# Patient Record
Sex: Male | Born: 1955
Health system: Southern US, Community
[De-identification: ages and names within clinical notes are randomized; demographics above are authoritative.]

## PROBLEM LIST (undated history)

## (undated) DIAGNOSIS — J189 Pneumonia, unspecified organism: Secondary | ICD-10-CM

## (undated) DIAGNOSIS — N200 Calculus of kidney: Secondary | ICD-10-CM

## (undated) DIAGNOSIS — K635 Polyp of colon: Secondary | ICD-10-CM

## (undated) DIAGNOSIS — E785 Hyperlipidemia, unspecified: Secondary | ICD-10-CM

## (undated) DIAGNOSIS — R7303 Prediabetes: Secondary | ICD-10-CM

## (undated) DIAGNOSIS — I10 Essential (primary) hypertension: Secondary | ICD-10-CM

## (undated) DIAGNOSIS — E119 Type 2 diabetes mellitus without complications: Secondary | ICD-10-CM

## (undated) DIAGNOSIS — K219 Gastro-esophageal reflux disease without esophagitis: Secondary | ICD-10-CM

## (undated) HISTORY — DX: Prediabetes: R73.03

## (undated) HISTORY — DX: Calculus of kidney: N20.0

## (undated) HISTORY — DX: Type 2 diabetes mellitus without complications: E11.9

## (undated) HISTORY — PX: ARTHROSCOPIC REPAIR ACL: SUR80

## (undated) HISTORY — DX: Polyp of colon: K63.5

## (undated) HISTORY — PX: NECK SURGERY: SHX720

## (undated) HISTORY — PX: LITHOTRIPSY: SUR834

## (undated) HISTORY — DX: Gastro-esophageal reflux disease without esophagitis: K21.9

## (undated) HISTORY — DX: Pneumonia, unspecified organism: J18.9

## (undated) HISTORY — DX: Essential (primary) hypertension: I10

## (undated) HISTORY — PX: CERVICAL SPINE SURGERY: SHX589

## (undated) HISTORY — DX: Hyperlipidemia, unspecified: E78.5

## (undated) HISTORY — PX: KNEE SURGERY: SHX244

---

## 1977-08-08 HISTORY — PX: HERNIA REPAIR: SHX51

## 2000-07-14 ENCOUNTER — Encounter: Payer: Self-pay | Admitting: Urology

## 2000-07-14 ENCOUNTER — Encounter: Admission: RE | Admit: 2000-07-14 | Discharge: 2000-07-14 | Payer: Self-pay | Admitting: Urology

## 2000-12-05 ENCOUNTER — Other Ambulatory Visit: Admission: RE | Admit: 2000-12-05 | Discharge: 2000-12-05 | Payer: Self-pay | Admitting: Urology

## 2003-10-01 ENCOUNTER — Ambulatory Visit (HOSPITAL_COMMUNITY): Admission: RE | Admit: 2003-10-01 | Discharge: 2003-10-01 | Payer: Self-pay | Admitting: Specialist

## 2007-01-15 ENCOUNTER — Ambulatory Visit: Payer: Self-pay | Admitting: Gastroenterology

## 2007-01-29 ENCOUNTER — Ambulatory Visit: Payer: Self-pay | Admitting: Gastroenterology

## 2007-05-24 ENCOUNTER — Ambulatory Visit (HOSPITAL_COMMUNITY): Admission: RE | Admit: 2007-05-24 | Discharge: 2007-05-24 | Payer: Self-pay | Admitting: Urology

## 2008-01-17 ENCOUNTER — Ambulatory Visit (HOSPITAL_COMMUNITY): Admission: RE | Admit: 2008-01-17 | Discharge: 2008-01-17 | Payer: Self-pay | Admitting: Urology

## 2008-03-08 HISTORY — PX: LITHOTRIPSY: SUR834

## 2010-03-05 IMAGING — CR DG ABDOMEN 1V
2 series · 2 of 2 positions shown · non-contrast
Comparison: One-view abdomen 05/24/2007.

CLINICAL DATA: Right ureteral calculus.  Pre lithotripsy.

ABDOMEN - 1 VIEW

[t abdomen supine (1 of 2)]
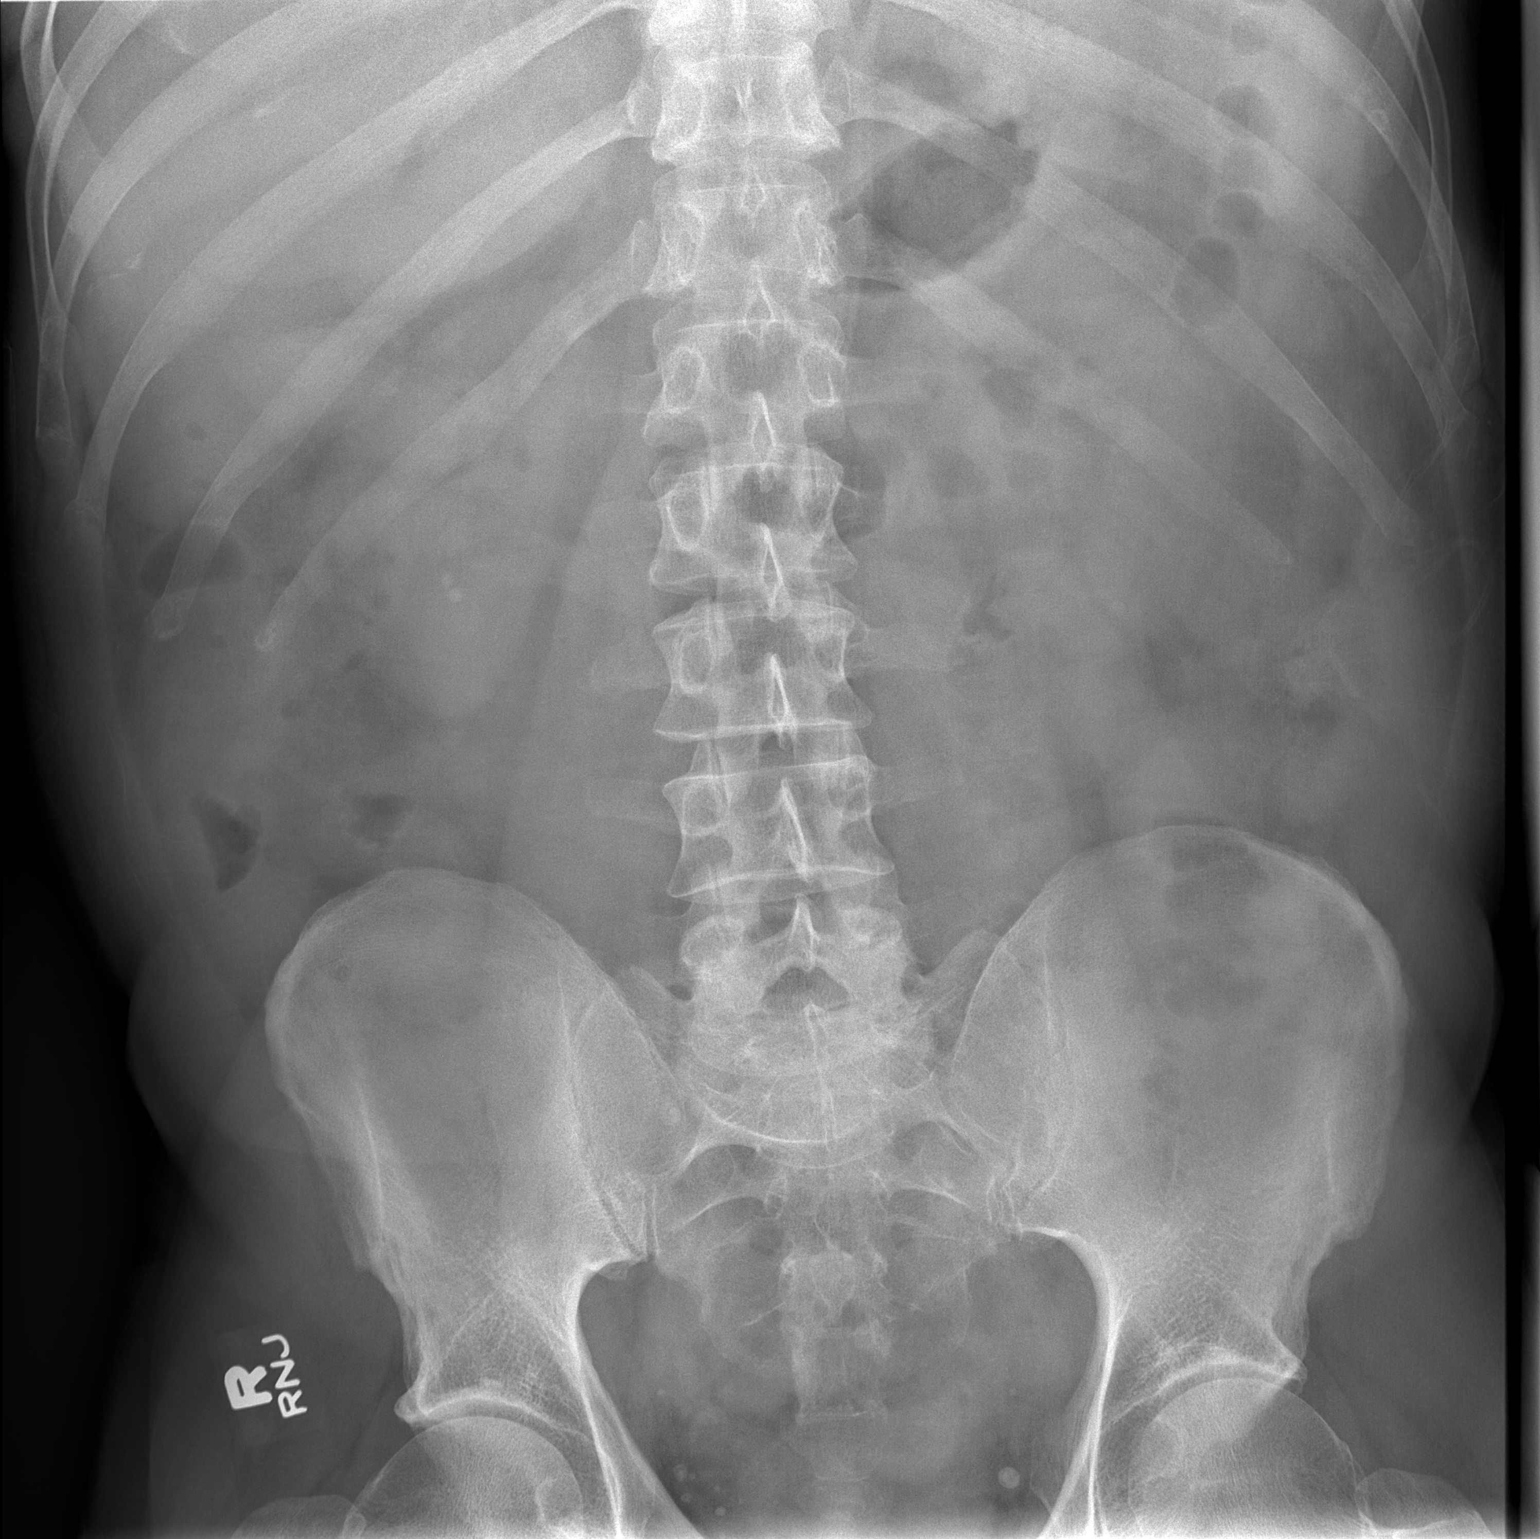

[t abdomen supine (2 of 2)]
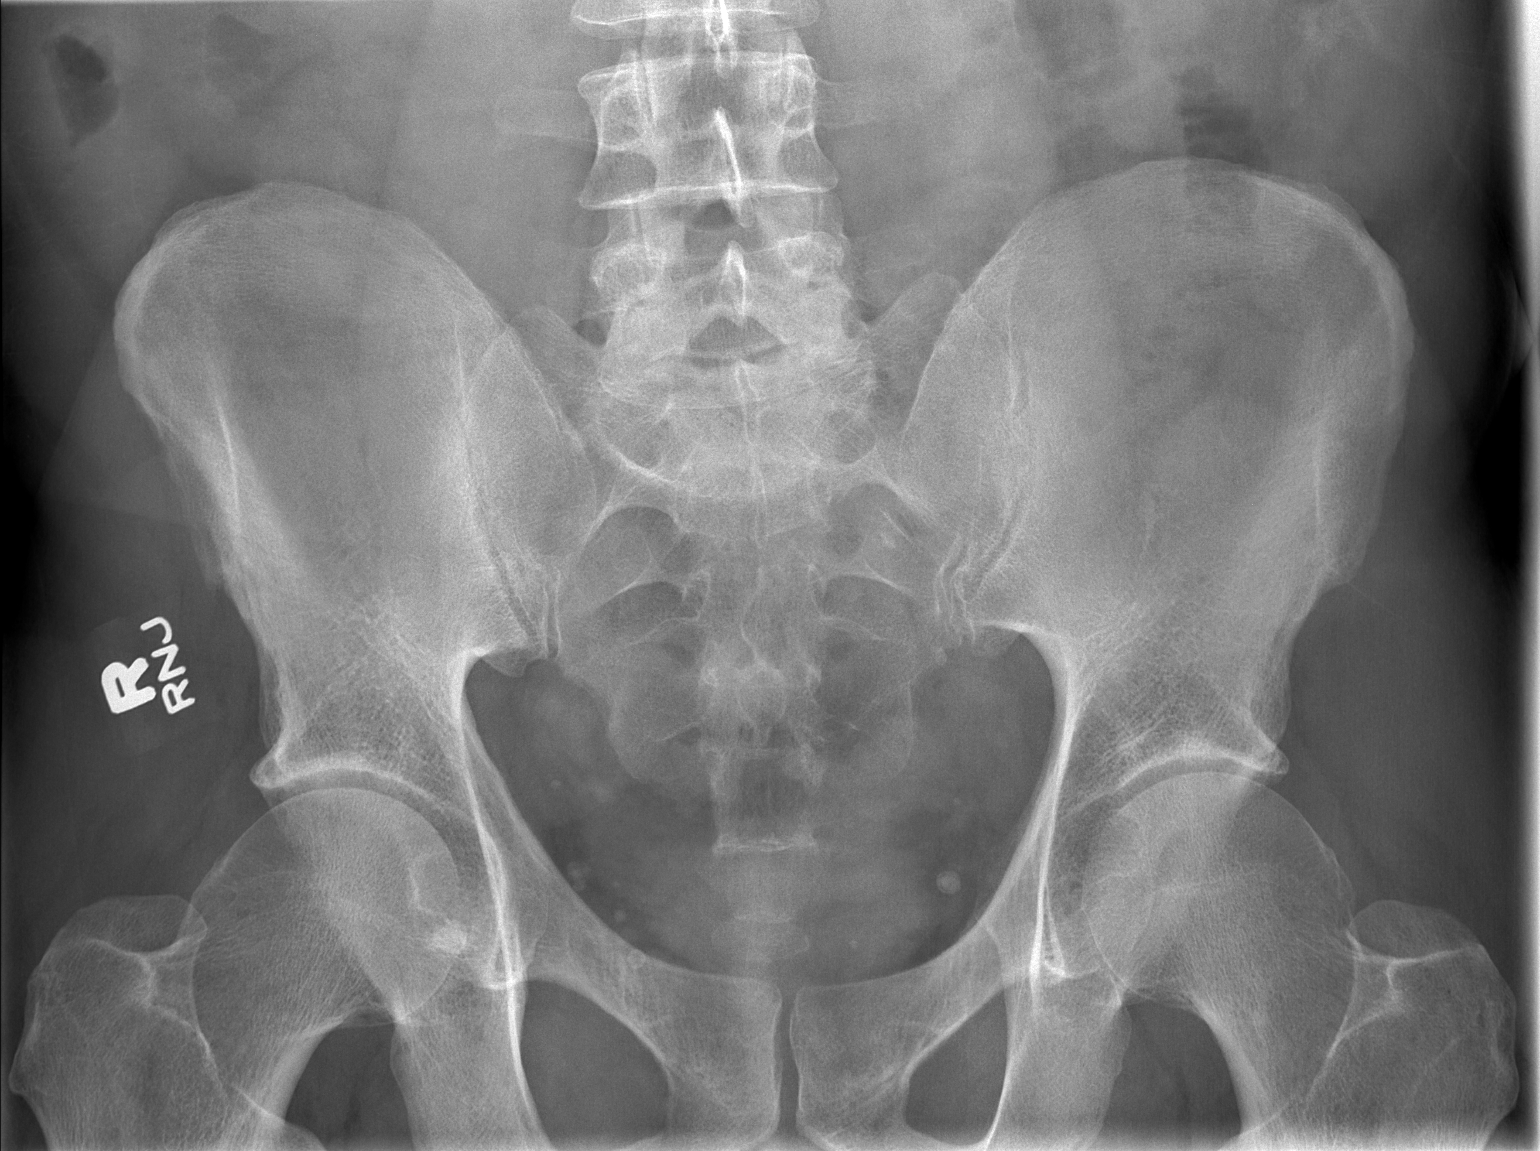

[2 of 2 positions shown; findings below may reference images not displayed]

FINDINGS: Bilateral nephrolithiasis appears improved compared with
the prior abdominal radiographs.  Two small calculi remain over the
lower pole of the right kidney.

There is a questionable new density over the right aspect of the
sacrum on one of the two views.  This could be osseous in nature.
Bilateral pelvic calcifications are otherwise unchanged from the
prior radiographs.  The bowel gas pattern appears normal.
IMPRESSION: 1.  Bilateral nephrolithiasis appears improved from abdominal
radiographs of May 2007.  No definite ureteral calculi are visible radiographically.
However, a calculus overlying the right hemi sacrum cannot be
excluded, and there are bilateral pelvic calcifications (which
appear stable).

## 2010-09-28 ENCOUNTER — Encounter: Payer: Self-pay | Admitting: Gastroenterology

## 2010-10-05 NOTE — Letter (Signed)
Summary: New Patient letter  [name]  [address] [place], [place] [address]   Phone: [phone]  Fax: [phone]       09/28/2010 MRN: [id number]  Joseph Tucker [address] [place], [place]  [address]  Dear Mr. Joseph Tucker,  Welcome to the Gastroenterology Division at [facility].    You are scheduled to see Dr.  Jarold Motto on 11-04-10 at 10:45am  on the 3rd floor at [facility], [address]  We ask that you try to arrive at our office 15 minutes prior to your appointment time to allow for check-in.  We would like you to complete the enclosed self-administered evaluation form prior to your visit and bring it with you on the day of your appointment.  We will review it with you.  Also, please bring a complete list of all your medications or, if you prefer, bring the medication bottles and we will list them.  Please bring your insurance card so that we may make a copy of it.  If your insurance requires a referral to see a specialist, please bring your referral form from your primary care physician.  Co-payments are due at the time of your visit and may be paid by cash, check or credit card.     Your office visit will consist of a consult with your physician (includes a physical exam), any laboratory testing he/she may order, scheduling of any necessary diagnostic testing (e.g. x-ray, ultrasound, CT-scan), and scheduling of a procedure (e.g. Endoscopy, Colonoscopy) if required.  Please allow enough time on your schedule to allow for any/all of these possibilities.    If you cannot keep your appointment, please call [phone] to cancel or reschedule prior to your appointment date.  This allows [place] the opportunity to schedule an appointment for another patient in need of care.  If you do not cancel or reschedule by 5 p.m. the business day prior to your appointment date, you will be charged a $50.00 late cancellation/no-show fee.    Thank you for  choosing [facility] for your medical needs.  We appreciate the opportunity to care for you.  Please visit [place] at our website  to learn more about our practice.                     Sincerely,                                                             The Gastroenterology Division

## 2010-11-04 ENCOUNTER — Ambulatory Visit (INDEPENDENT_AMBULATORY_CARE_PROVIDER_SITE_OTHER): Payer: No Typology Code available for payment source | Admitting: Gastroenterology

## 2010-11-04 ENCOUNTER — Encounter: Payer: Self-pay | Admitting: Gastroenterology

## 2010-11-04 ENCOUNTER — Other Ambulatory Visit (INDEPENDENT_AMBULATORY_CARE_PROVIDER_SITE_OTHER): Payer: No Typology Code available for payment source

## 2010-11-04 DIAGNOSIS — K219 Gastro-esophageal reflux disease without esophagitis: Secondary | ICD-10-CM

## 2010-11-04 DIAGNOSIS — K589 Irritable bowel syndrome without diarrhea: Secondary | ICD-10-CM

## 2010-11-04 DIAGNOSIS — Z860100 Personal history of colon polyps, unspecified: Secondary | ICD-10-CM

## 2010-11-04 LAB — BASIC METABOLIC PANEL
Chloride: 109 mEq/L (ref 96–112)
GFR: 81.29 mL/min (ref >60.00)
Potassium: 4.4 mEq/L (ref 3.5–5.1)
Sodium: 143 mEq/L (ref 135–145)

## 2010-11-04 LAB — HEPATIC FUNCTION PANEL
AST: 22 U/L (ref 0–37)
Alkaline Phosphatase: 41 U/L (ref 39–117)
Bilirubin, Direct: 0.1 mg/dL (ref 0.0–0.3)

## 2010-11-04 LAB — CBC WITH DIFFERENTIAL/PLATELET
Basophils Absolute: 0 10*3/uL (ref 0.0–0.1)
Eosinophils Absolute: 0.1 10*3/uL (ref 0.0–0.7)
HCT: 41.1 % (ref 39.0–52.0)
Hemoglobin: 14 g/dL (ref 13.0–17.0)
Lymphs Abs: 1.3 10*3/uL (ref 0.7–4.0)
MCHC: 34.1 g/dL (ref 30.0–36.0)
MCV: 86.6 fl (ref 78.0–100.0)
Monocytes Absolute: 0.7 10*3/uL (ref 0.1–1.0)
Neutro Abs: 6.1 10*3/uL (ref 1.4–7.7)
RDW: 14.1 % (ref 11.5–14.6)

## 2010-11-04 LAB — TSH: TSH: 2.21 u[IU]/mL (ref 0.35–5.50)

## 2010-11-04 LAB — SEDIMENTATION RATE: Sed Rate: 12 mm/hr (ref 0–22)

## 2010-11-04 LAB — IBC PANEL: Iron: 45 ug/dL (ref 42–165)

## 2010-11-04 MED ORDER — CILIDINIUM-CHLORDIAZEPOXIDE 2.5-5 MG PO CAPS: 1.0000 | ORAL_CAPSULE | Freq: Three times a day (TID) | ORAL | Status: DC

## 2010-11-04 MED ORDER — DEXLANSOPRAZOLE 60 MG PO CPDR: 60.0000 mg | DELAYED_RELEASE_CAPSULE | Freq: Every day | ORAL | Status: DC

## 2010-11-04 MED ORDER — PEG-KCL-NACL-NASULF-NA ASC-C 100 G PO SOLR: 1.0000 | Freq: Once | ORAL | Status: AC

## 2010-11-04 NOTE — Patient Instructions (Signed)
Your procedure has been scheduled for _____, please follow the seperate instructions.  Your prescription(s) have been sent to you pharmacy.  Please go to the basement today for your labs.  Today we gave you a low gas diet handout and a GERD handout.

## 2010-11-04 NOTE — Progress Notes (Signed)
History of Present Illness:  This is a 55 year old Caucasian male referred by Dr. Gilmore Laroche for evaluation of diffuse abdominal discomfort with abdominal gas and bloating, excessive flatus, belching, burping, and acid reflux symptoms refractory to Prilosec therapy. Patient describes early satiety, loss of appetite, but no melena or hematochezia. Denies abuse of alcohol, cigarettes, or NSAIDs. He does have  untreated sleep apnea. 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 He is on  muscle relaxers and also Gabapentin . He denies painful swallowing, dysphagia, or any specific hepatobiliary complaints. Previous colonoscopies per recurrent colon polyps, he also has a history of diverticulosis. He has had previous cardiology evaluation for atypical chest pain with a negative cardiac workup. Did have a prominent cecal polyp removed in 2005. He currently is taking tramadol for pain, probiotics, and daily aspirin. He denies abuse of alcohol or cigarettes. There is no past history of known hepatitis or pancreatitis. Does have a mild degenerative arthritis also in his hands, but denies a history of Raynaud's phenomenon.     ROS: The remainder of the 10 point ROS is negative... muscle pains and spasms and chronic low back pain noted. He denies recurrent cardiopulmonary, genitourinary, or neurological problems otherwise. He also denies a specific intolerances to foods and denies use of sorbitol or fructose. There is no known history of lactose intolerance. Family history is negative for celiac disease or other known gastrointestinal issues.     Physical Exam: General well developed well nourished patient in no acute distress, appearing their stated age Eyes PERRLA, no icterus, fundoscopic exam per opthamologist Skin no lesions noted Neck supple, no adenopathy, no thyroid enlargement, no tenderness Chest clear to percussion and  auscultation Heart no significant murmurs, gallops or rubs noted Abdomen no hepatosplenomegaly masses or tenderness, BS normal.  Extremities no acute joint lesions, edema, phlebitis or evidence of cellulitis. Neurologic patient oriented x 3, cranial nerves intact, no focal neurologic deficits noted. Psychological mental status normal and normal affect.  Assessment and plan: He has rather severe acid reflux and I have placed him on Dexilant 60 mg a day with standard antireflux maneuvers. Labs have been ordered and we will check endoscopy with small bowel biopsy and exam for H. pylori. We need to exclude celiac disease and H. pylori infection. Also followup colonoscopy has been scheduled per his history of adenomatous polyps. I placed him on a low gas diet and also Librax 3 times a day before his meals. He may need further workup and therapy for possible bacterial overgrowth syndrome.

## 2010-11-05 ENCOUNTER — Encounter: Payer: Self-pay | Admitting: Gastroenterology

## 2010-11-07 HISTORY — PX: COLONOSCOPY: SHX174

## 2010-11-09 ENCOUNTER — Other Ambulatory Visit: Payer: Self-pay | Admitting: Family Medicine

## 2010-11-09 DIAGNOSIS — M545 Low back pain, unspecified: Secondary | ICD-10-CM

## 2010-11-10 ENCOUNTER — Ambulatory Visit
Admission: RE | Admit: 2010-11-10 | Discharge: 2010-11-10 | Disposition: A | Discharge status: Home / Self Care | Payer: No Typology Code available for payment source | Source: Ambulatory Visit | Attending: Family Medicine | Admitting: Family Medicine

## 2010-11-10 ENCOUNTER — Other Ambulatory Visit: Payer: Self-pay | Admitting: Family Medicine

## 2010-11-10 ENCOUNTER — Other Ambulatory Visit: Payer: Self-pay | Admitting: *Deleted

## 2010-11-10 DIAGNOSIS — Z139 Encounter for screening, unspecified: Secondary | ICD-10-CM

## 2010-11-10 DIAGNOSIS — M545 Low back pain, unspecified: Secondary | ICD-10-CM

## 2010-11-10 MED ORDER — FERROUS FUM-IRON POLYSACCH 162-115.2 MG PO CAPS: 1.0000 | ORAL_CAPSULE | Freq: Every day | ORAL | Status: DC

## 2010-11-10 NOTE — Telephone Encounter (Signed)
Message copied by Harlow Mares on Wed Nov 10, 2010  4:51 PM ------      Message from: PATTERSON, DAVID      Created: Fri Nov 05, 2010  8:24 AM       Start B12 shots and replacement therapy and also daily tandem

## 2010-11-10 NOTE — Telephone Encounter (Addendum)
Patients wife aware. Of labs and iron rx. They will make visit for b12 next week.

## 2010-11-11 ENCOUNTER — Encounter: Payer: Self-pay | Admitting: Gastroenterology

## 2010-11-15 ENCOUNTER — Telehealth: Payer: Self-pay | Admitting: Gastroenterology

## 2010-11-15 ENCOUNTER — Ambulatory Visit: Payer: No Typology Code available for payment source | Admitting: Gastroenterology

## 2010-11-15 ENCOUNTER — Encounter: Payer: Self-pay | Admitting: Gastroenterology

## 2010-11-15 DIAGNOSIS — K2270 Barrett's esophagus without dysplasia: Secondary | ICD-10-CM

## 2010-11-15 DIAGNOSIS — K5710 Diverticulosis of small intestine without perforation or abscess without bleeding: Secondary | ICD-10-CM

## 2010-11-15 DIAGNOSIS — D1330 Benign neoplasm of unspecified part of small intestine: Secondary | ICD-10-CM

## 2010-11-15 DIAGNOSIS — K5730 Diverticulosis of large intestine without perforation or abscess without bleeding: Secondary | ICD-10-CM

## 2010-11-15 DIAGNOSIS — Z86010 Personal history of colonic polyps: Secondary | ICD-10-CM

## 2010-11-15 DIAGNOSIS — K219 Gastro-esophageal reflux disease without esophagitis: Secondary | ICD-10-CM

## 2010-11-15 DIAGNOSIS — K449 Diaphragmatic hernia without obstruction or gangrene: Secondary | ICD-10-CM

## 2010-11-15 DIAGNOSIS — K648 Other hemorrhoids: Secondary | ICD-10-CM

## 2010-11-15 MED ORDER — SODIUM CHLORIDE 0.9 % IV SOLN: 500.0000 mL | INTRAVENOUS | Status: DC

## 2010-11-15 NOTE — Patient Instructions (Signed)
Discharge instruction given with verbal understanding. Handout on Diverticulosis and hemorrhoids, and hiatal hernia given. Instructions given to resume previous medications.

## 2010-11-15 NOTE — Telephone Encounter (Signed)
Back pain today; has been to chiropractor this am.  He wants to take Tramadol for pain.  RN advised to take as directed up until 2 hrs (1330) before scheduled procedure today (1500).

## 2010-11-16 ENCOUNTER — Telehealth: Payer: Self-pay | Admitting: Gastroenterology

## 2010-11-16 ENCOUNTER — Ambulatory Visit (INDEPENDENT_AMBULATORY_CARE_PROVIDER_SITE_OTHER): Payer: No Typology Code available for payment source | Admitting: Gastroenterology

## 2010-11-16 ENCOUNTER — Telehealth: Payer: Self-pay | Admitting: *Deleted

## 2010-11-16 ENCOUNTER — Other Ambulatory Visit: Payer: Self-pay | Admitting: Gastroenterology

## 2010-11-16 DIAGNOSIS — Z1211 Encounter for screening for malignant neoplasm of colon: Secondary | ICD-10-CM

## 2010-11-16 DIAGNOSIS — K219 Gastro-esophageal reflux disease without esophagitis: Secondary | ICD-10-CM

## 2010-11-16 DIAGNOSIS — E538 Deficiency of other specified B group vitamins: Secondary | ICD-10-CM

## 2010-11-16 LAB — HELICOBACTER PYLORI SCREEN-BIOPSY: UREASE: NEGATIVE

## 2010-11-16 MED ORDER — CYANOCOBALAMIN 1000 MCG/ML IJ SOLN
1000.0000 ug | INTRAMUSCULAR | Status: AC
  Administered 2010-11-16 – 2010-12-02 (×3): 1000 ug via INTRAMUSCULAR

## 2010-11-16 NOTE — Telephone Encounter (Signed)
Patient will come today for b12 1 of 3 weekly and then decide on monthly injections or nascobal.

## 2010-11-16 NOTE — Telephone Encounter (Signed)

## 2010-11-22 ENCOUNTER — Encounter: Payer: Self-pay | Admitting: Gastroenterology

## 2010-11-25 ENCOUNTER — Ambulatory Visit (INDEPENDENT_AMBULATORY_CARE_PROVIDER_SITE_OTHER): Payer: No Typology Code available for payment source | Admitting: Gastroenterology

## 2010-11-25 DIAGNOSIS — E538 Deficiency of other specified B group vitamins: Secondary | ICD-10-CM

## 2010-12-02 ENCOUNTER — Ambulatory Visit (INDEPENDENT_AMBULATORY_CARE_PROVIDER_SITE_OTHER): Payer: No Typology Code available for payment source | Admitting: Gastroenterology

## 2010-12-02 DIAGNOSIS — E538 Deficiency of other specified B group vitamins: Secondary | ICD-10-CM

## 2011-01-04 ENCOUNTER — Ambulatory Visit (INDEPENDENT_AMBULATORY_CARE_PROVIDER_SITE_OTHER): Payer: No Typology Code available for payment source | Admitting: Gastroenterology

## 2011-01-04 DIAGNOSIS — E538 Deficiency of other specified B group vitamins: Secondary | ICD-10-CM

## 2011-01-04 MED ORDER — CYANOCOBALAMIN 1000 MCG/ML IJ SOLN
1000.0000 ug | INTRAMUSCULAR | Status: AC
  Administered 2011-01-04 – 2011-05-09 (×5): 1000 ug via INTRAMUSCULAR

## 2011-02-04 ENCOUNTER — Ambulatory Visit (INDEPENDENT_AMBULATORY_CARE_PROVIDER_SITE_OTHER): Payer: No Typology Code available for payment source | Admitting: Gastroenterology

## 2011-02-04 DIAGNOSIS — E538 Deficiency of other specified B group vitamins: Secondary | ICD-10-CM

## 2011-03-07 ENCOUNTER — Ambulatory Visit (INDEPENDENT_AMBULATORY_CARE_PROVIDER_SITE_OTHER): Payer: No Typology Code available for payment source | Admitting: Gastroenterology

## 2011-03-07 DIAGNOSIS — E538 Deficiency of other specified B group vitamins: Secondary | ICD-10-CM

## 2011-04-08 ENCOUNTER — Ambulatory Visit (INDEPENDENT_AMBULATORY_CARE_PROVIDER_SITE_OTHER): Payer: No Typology Code available for payment source | Admitting: Gastroenterology

## 2011-04-08 DIAGNOSIS — E538 Deficiency of other specified B group vitamins: Secondary | ICD-10-CM

## 2011-05-09 ENCOUNTER — Ambulatory Visit (INDEPENDENT_AMBULATORY_CARE_PROVIDER_SITE_OTHER): Payer: No Typology Code available for payment source | Admitting: Gastroenterology

## 2011-05-09 DIAGNOSIS — D519 Vitamin B12 deficiency anemia, unspecified: Secondary | ICD-10-CM

## 2011-05-09 DIAGNOSIS — E538 Deficiency of other specified B group vitamins: Secondary | ICD-10-CM

## 2011-06-10 ENCOUNTER — Ambulatory Visit (INDEPENDENT_AMBULATORY_CARE_PROVIDER_SITE_OTHER): Payer: No Typology Code available for payment source | Admitting: Gastroenterology

## 2011-06-10 DIAGNOSIS — E538 Deficiency of other specified B group vitamins: Secondary | ICD-10-CM

## 2011-06-10 MED ORDER — CYANOCOBALAMIN 1000 MCG/ML IJ SOLN
1000.0000 ug | INTRAMUSCULAR | Status: DC
  Administered 2011-06-10 – 2011-08-11 (×3): 1000 ug via INTRAMUSCULAR

## 2011-06-14 ENCOUNTER — Other Ambulatory Visit: Payer: Self-pay | Admitting: Gastroenterology

## 2011-06-15 ENCOUNTER — Other Ambulatory Visit: Payer: Self-pay | Admitting: Gastroenterology

## 2011-06-21 ENCOUNTER — Telehealth: Payer: Self-pay | Admitting: *Deleted

## 2011-06-21 MED ORDER — DEXLANSOPRAZOLE 60 MG PO CPDR: DELAYED_RELEASE_CAPSULE | ORAL | Status: DC

## 2011-06-21 NOTE — Telephone Encounter (Signed)
Wife reports they can't get the Dexilant. Called [facility] [address] and they faxed a script earlier that I can't find. Ordered med.

## 2011-06-22 ENCOUNTER — Other Ambulatory Visit: Payer: Self-pay | Admitting: *Deleted

## 2011-06-22 DIAGNOSIS — K219 Gastro-esophageal reflux disease without esophagitis: Secondary | ICD-10-CM

## 2011-06-22 DIAGNOSIS — K589 Irritable bowel syndrome without diarrhea: Secondary | ICD-10-CM

## 2011-06-22 DIAGNOSIS — Z86010 Personal history of colonic polyps: Secondary | ICD-10-CM

## 2011-06-22 MED ORDER — CILIDINIUM-CHLORDIAZEPOXIDE 2.5-5 MG PO CAPS: 1.0000 | ORAL_CAPSULE | Freq: Three times a day (TID) | ORAL | Status: DC

## 2011-07-08 ENCOUNTER — Ambulatory Visit (INDEPENDENT_AMBULATORY_CARE_PROVIDER_SITE_OTHER): Payer: No Typology Code available for payment source | Admitting: Gastroenterology

## 2011-07-08 DIAGNOSIS — E538 Deficiency of other specified B group vitamins: Secondary | ICD-10-CM

## 2011-07-11 NOTE — Progress Notes (Signed)
ok 

## 2011-08-11 ENCOUNTER — Ambulatory Visit (INDEPENDENT_AMBULATORY_CARE_PROVIDER_SITE_OTHER): Payer: No Typology Code available for payment source | Admitting: Gastroenterology

## 2011-08-11 DIAGNOSIS — E538 Deficiency of other specified B group vitamins: Secondary | ICD-10-CM

## 2012-10-12 ENCOUNTER — Ambulatory Visit (INDEPENDENT_AMBULATORY_CARE_PROVIDER_SITE_OTHER): Payer: Self-pay | Admitting: Surgery

## 2012-10-19 ENCOUNTER — Ambulatory Visit (INDEPENDENT_AMBULATORY_CARE_PROVIDER_SITE_OTHER): Payer: No Typology Code available for payment source | Admitting: Surgery

## 2012-10-19 ENCOUNTER — Encounter (INDEPENDENT_AMBULATORY_CARE_PROVIDER_SITE_OTHER): Payer: Self-pay | Admitting: Surgery

## 2012-10-19 VITALS — BP 140/84 | HR 64 | Temp 97.1°F | Resp 16 | Ht 70.5 in | Wt 191.0 lb

## 2012-10-19 DIAGNOSIS — K4091 Unilateral inguinal hernia, without obstruction or gangrene, recurrent: Secondary | ICD-10-CM | POA: Insufficient documentation

## 2012-10-19 DIAGNOSIS — K429 Umbilical hernia without obstruction or gangrene: Secondary | ICD-10-CM | POA: Insufficient documentation

## 2012-10-19 NOTE — Progress Notes (Signed)
Patient ID: Joseph Tucker, male   DOB: [birth year], 57 y.o.   MRN: [id number]  Chief Complaint  Patient presents with  . New Evaluation    eval LIH    HPI Joseph Tucker is a 57 y.o. male.  Primary care physician is Dr. Gilmore Laroche - referred for evaluation of left inguinal hernia  HPI This is a 57 year old male who presents with a long history of a left inguinal hernia. He had this repaired in 1978. This was done without mesh. He states that he had a recurrence about a year later. Over the years this has slowly become larger. He was seen by Dr. Ovidio Kin about 12 years ago for this problem. The patient did not have this repaired due to a very busy work schedule. Recently this area has become more uncomfortable and larger. It remains reducible. Last weeks area became slightly bruised in appearance. He comes in now to discuss surgical repair. He denies any obstructive symptoms. Bowel movements are normal.  Past Medical History  Diagnosis Date  . Colon polyps   . Kidney stones   . Pneumonia     Past Surgical History  Procedure Laterality Date  . Hernia repair  1979  . Neck surgery    . Knee surgery      RIGHT KNEE  . Lithotripsy  08 & 09  . Cervical spine surgery    . Arthroscopic repair acl      Family History  Problem Relation Age of Onset  . Colon cancer Father   . Colon polyps Father   . Heart disease Father   . Cancer Father     colon  . Stomach cancer Mother   . Cancer Mother     brain  . Leukemia Sister   . Cancer Sister     leukemia    Social History History  Substance Use Topics  . Smoking status: Never Smoker   . Smokeless tobacco: Not on file  . Alcohol Use: No    Allergies  Allergen Reactions  . Codeine   . Penicillins     Current Outpatient Prescriptions  Medication Sig Dispense Refill  . aspirin 81 MG tablet Take 81 mg by mouth daily.        . Biotin (RA BIOTIN) 1000 MCG tablet Take 1,000 mcg by mouth 3 (three) times daily.      .  ferrous fumarate-iron polysaccharide complex (TANDEM) 162-115.2 MG CAPS Take 1 capsule by mouth daily with breakfast.  30 capsule  6  . Fexofenadine HCl (ALLEGRA PO) Take by mouth 1 dose over 46 hours.        . fish oil-omega-3 fatty acids 1000 MG capsule Take 2 g by mouth daily.      Marland Kitchen omeprazole-sodium bicarbonate (ZEGERID) 40-1100 MG per capsule Take 1 capsule by mouth daily before breakfast.      . Probiotic Product (PROBIOTIC DAILY PO) Take by mouth.      . vitamin B-12 (CYANOCOBALAMIN) 1000 MCG tablet Take 1,000 mcg by mouth daily.       Current Facility-Administered Medications  Medication Dose Route Frequency Provider Last Rate Last Dose  . 0.9 %  sodium chloride infusion  500 mL Intravenous Continuous Mardella Layman, MD      . cyanocobalamin ((VITAMIN B-12)) injection 1,000 mcg  1,000 mcg Intramuscular Q30 days Mardella Layman, MD   1,000 mcg at 08/11/11 5621    Review of Systems Review of Systems  Constitutional: Negative for fever,  chills and unexpected weight change.  HENT: Negative for hearing loss, congestion, sore throat, trouble swallowing and voice change.   Eyes: Negative for visual disturbance.  Respiratory: Negative for cough and wheezing.   Cardiovascular: Negative for chest pain, palpitations and leg swelling.  Gastrointestinal: Negative for nausea, vomiting, abdominal pain, diarrhea, constipation, blood in stool, abdominal distention, anal bleeding and rectal pain.  Genitourinary: Negative for hematuria and difficulty urinating.  Musculoskeletal: Negative for arthralgias.  Skin: Negative for rash and wound.  Neurological: Negative for seizures, syncope, weakness and headaches.  Hematological: Negative for adenopathy. Does not bruise/bleed easily.  Psychiatric/Behavioral: Negative for confusion.    Blood pressure 140/84, pulse 64, temperature 97.1 F (36.2 C), temperature source Temporal, resp. rate 16, height 5' 10.5" (1.791 m), weight 191 lb (86.637  kg).  Physical Exam Physical Exam WDWN in NAD HEENT:  EOMI, sclera anicteric Neck:  No masses, no thyromegaly Lungs:  CTA bilaterally; normal respiratory effort CV:  Regular rate and rhythm; no murmurs Abd:  +bowel sounds, soft, non-tender, protruding umbilicus - reducible GU:  Bilateral descended testes; no testicular masses; palpable left inguinal hernia - reducible; lateral to external ring No sign of right inguinal hernia Ext:  Well-perfused; no edema Skin:  Warm, dry; no sign of jaundice  Data Reviewed none  Assessment    Left inguinal hernia - recurrent; reducible Umbilical hernia     Plan    Left inguinal hernia repair with mesh.  Umbilical hernia repair with mesh.  The surgical procedure has been discussed with the patient.  Potential risks, benefits, alternative treatments, and expected outcomes have been explained.  All of the patient's questions at this time have been answered.  The likelihood of reaching the patient's treatment goal is good.  The patient understand the proposed surgical procedure and wishes to proceed.         TSUEI,MATTHEW K. 10/19/2012, 11:10 AM

## 2012-11-14 DIAGNOSIS — K429 Umbilical hernia without obstruction or gangrene: Secondary | ICD-10-CM

## 2012-11-14 DIAGNOSIS — K4091 Unilateral inguinal hernia, without obstruction or gangrene, recurrent: Secondary | ICD-10-CM

## 2012-11-26 ENCOUNTER — Encounter (INDEPENDENT_AMBULATORY_CARE_PROVIDER_SITE_OTHER): Payer: Self-pay | Admitting: Surgery

## 2012-11-26 ENCOUNTER — Ambulatory Visit (INDEPENDENT_AMBULATORY_CARE_PROVIDER_SITE_OTHER): Payer: No Typology Code available for payment source | Admitting: Surgery

## 2012-11-26 VITALS — BP 148/90 | HR 68 | Temp 97.8°F | Resp 16 | Ht 70.5 in | Wt 188.0 lb

## 2012-11-26 DIAGNOSIS — K4091 Unilateral inguinal hernia, without obstruction or gangrene, recurrent: Secondary | ICD-10-CM

## 2012-11-26 DIAGNOSIS — K429 Umbilical hernia without obstruction or gangrene: Secondary | ICD-10-CM

## 2012-11-26 NOTE — Patient Instructions (Signed)
Heating pad and ibuprofen 600 mg with meals as needed for the next couple of weeks.  Call us back for an appointment if there is no improvement.

## 2012-11-26 NOTE — Progress Notes (Signed)
Status post open repair of a recurrent left inguinal hernia and primary suture repair of an umbilical hernia on 11/14/12. The patient had significant amounts of scarring around his left spermatic cord which required additional dissection. He had a standard Radio broadcast assistant. The umbilical defect was less than a centimeter and was) thoroughly. The patient is having minimal discomfort at his umbilicus. He is still having some discomfort in his left groin, especially when stretching out in bed at night. He is sleeping with some pillows underneath his legs which seems to help. He is no longer using any pain medication.  Both incisions seem to be healing well with no sign of infection. He has the expected amount of swelling around each incision. Most of his tenderness is at the left external ring. No sign of recurrent hernia.  I explained to him that this amount of soreness at the external ring is not unexpected due to the amount of scarring and dissection that had to be done due to the recurrent hernia. We will use a heating pad as well as ibuprofen to try to help with the swelling in this area. I encouraged him to try to keep this area stretched out. We will see him back as needed. He should limit his level of activity for at least the next 2-3 weeks.  Joseph Tucker. [name], MD, [facility]  11/26/2012 9:28 AM

## 2012-12-27 IMAGING — CR DG ORBITS FOR FOREIGN BODY
2 series · 2 of 2 positions shown · non-contrast
Comparison: 10/01/2003

CLINICAL DATA: Metal working/exposure; clearance prior to MRI

ORBITS FOR FOREIGN BODY - 2 VIEW

[view not recorded (1 of 2)]
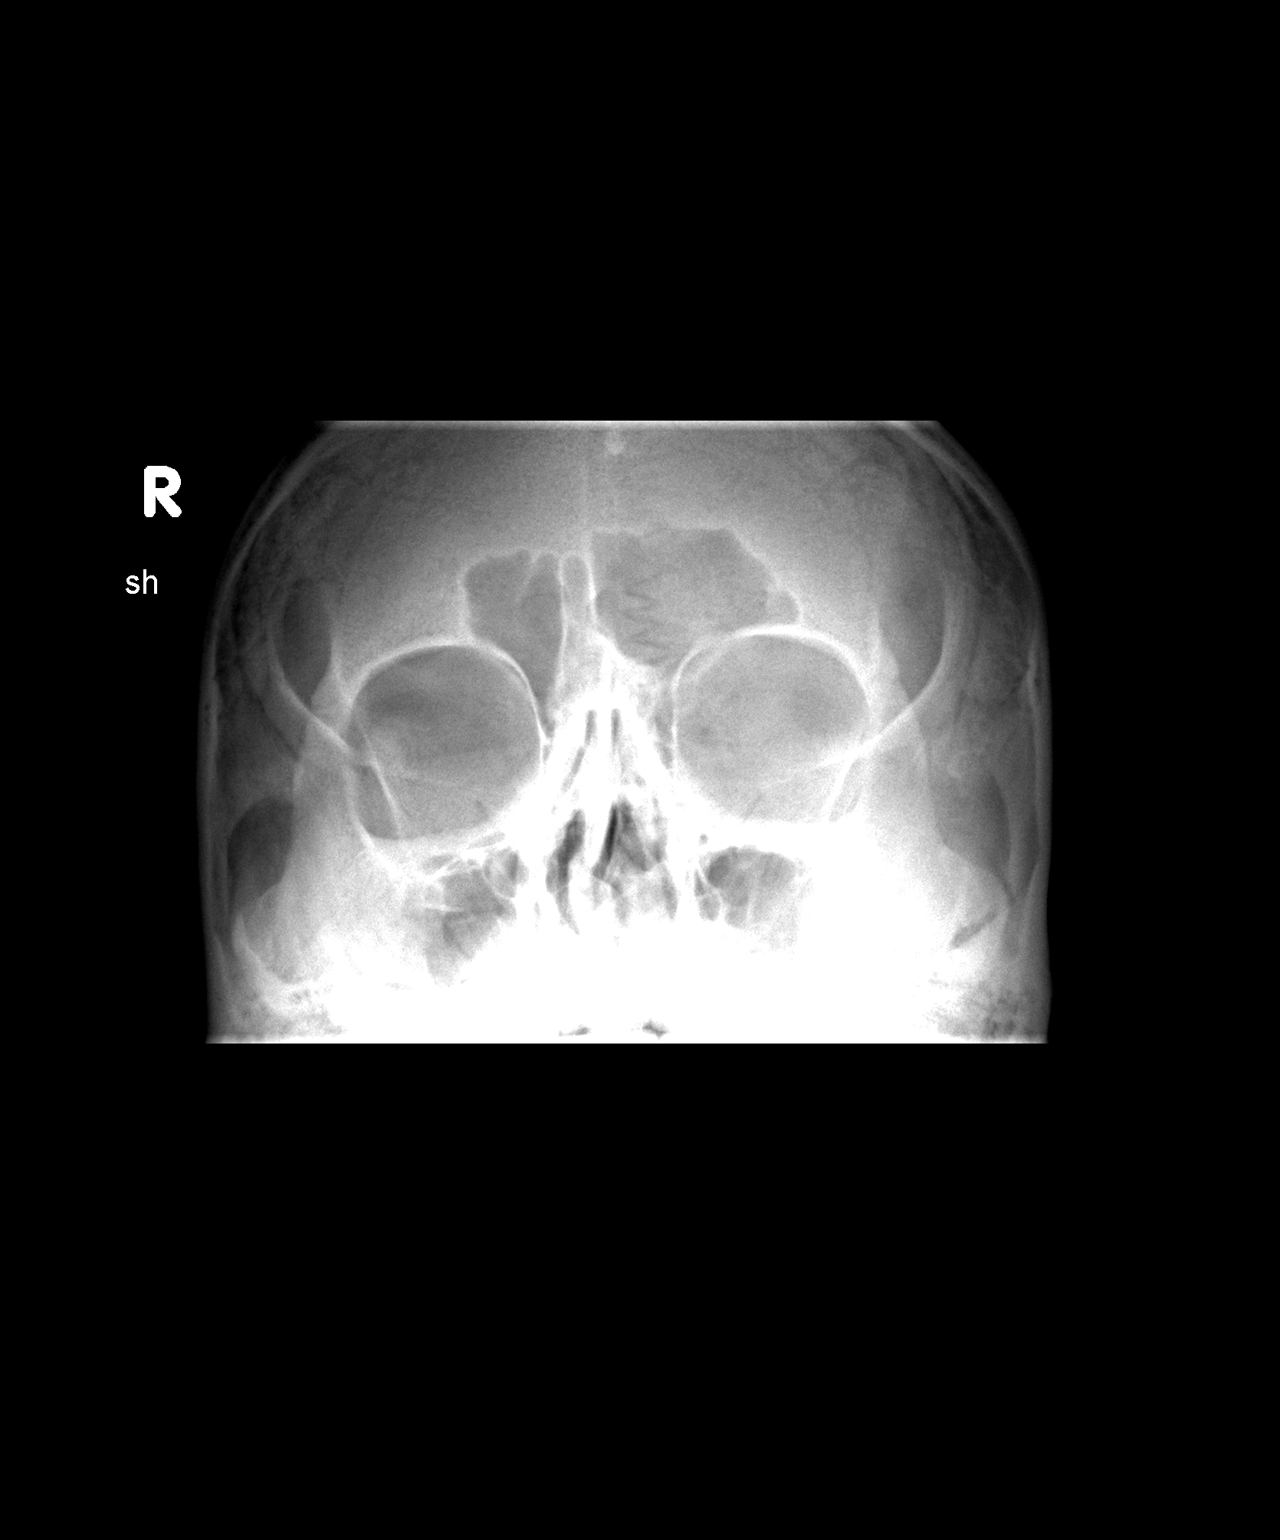

[view not recorded (2 of 2)]
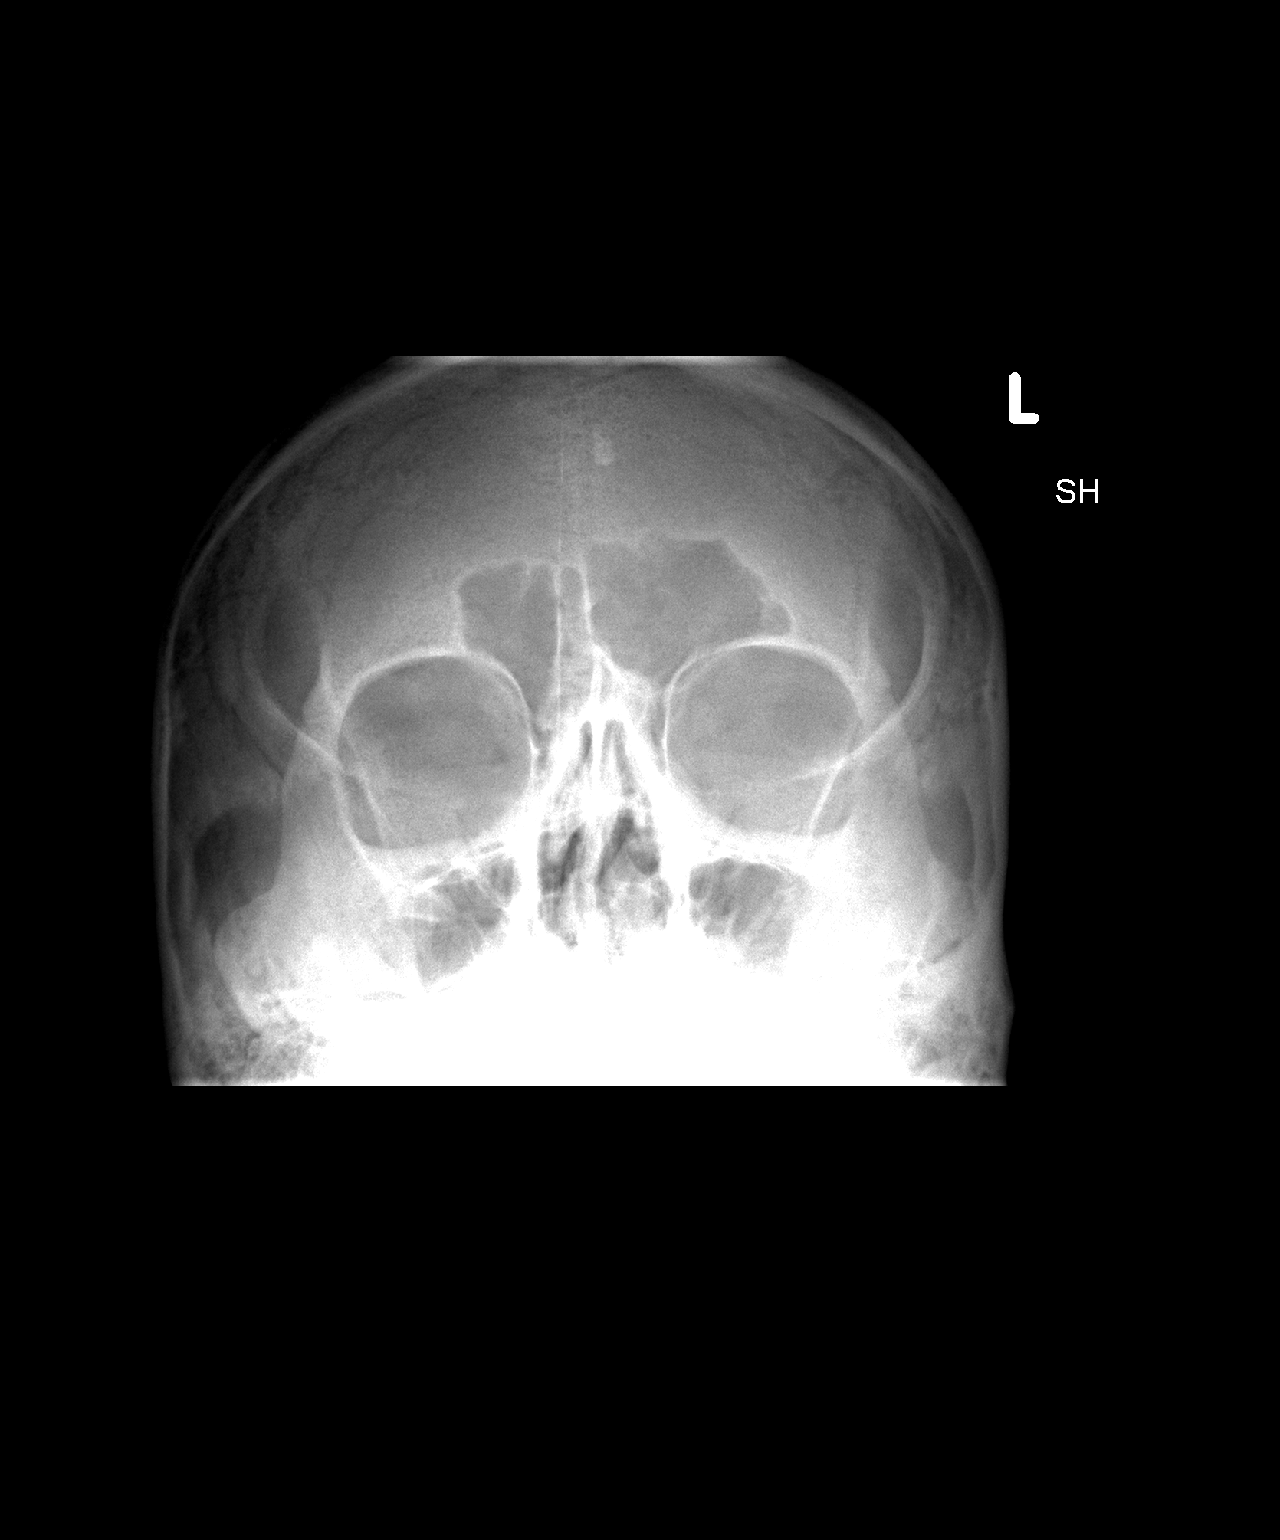

[2 of 2 positions shown; findings below may reference images not displayed]

FINDINGS: There is no evidence of metallic foreign body within the
orbits.  No significant bone abnormality identified.
IMPRESSION: No evidence of metallic foreign body within the orbits.

## 2013-09-13 ENCOUNTER — Other Ambulatory Visit: Payer: No Typology Code available for payment source

## 2013-09-13 ENCOUNTER — Other Ambulatory Visit: Payer: Self-pay | Admitting: Family Medicine

## 2013-09-13 DIAGNOSIS — Z0000 Encounter for general adult medical examination without abnormal findings: Secondary | ICD-10-CM

## 2013-09-13 LAB — CBC WITH DIFFERENTIAL/PLATELET
BASOS ABS: 0.1 10*3/uL (ref 0.0–0.1)
BASOS PCT: 1 % (ref 0–1)
EOS PCT: 1 % (ref 0–5)
Eosinophils Absolute: 0.1 10*3/uL (ref 0.0–0.7)
HEMATOCRIT: 42 % (ref 39.0–52.0)
HEMOGLOBIN: 14.9 g/dL (ref 13.0–17.0)
Lymphocytes Relative: 25 % (ref 12–46)
Lymphs Abs: 1.4 10*3/uL (ref 0.7–4.0)
MCH: 29.6 pg (ref 26.0–34.0)
MCHC: 35.5 g/dL (ref 30.0–36.0)
MCV: 83.5 fL (ref 78.0–100.0)
MONO ABS: 0.5 10*3/uL (ref 0.1–1.0)
MONOS PCT: 8 % (ref 3–12)
Neutro Abs: 3.7 10*3/uL (ref 1.7–7.7)
Neutrophils Relative %: 65 % (ref 43–77)
Platelets: 271 10*3/uL (ref 150–400)
RBC: 5.03 MIL/uL (ref 4.22–5.81)
RDW: 14.1 % (ref 11.5–15.5)
WBC: 5.8 10*3/uL (ref 4.0–10.5)

## 2013-09-13 LAB — LIPID PANEL
CHOL/HDL RATIO: 4.5 ratio
Cholesterol: 233 mg/dL — ABNORMAL HIGH (ref 0–200)
HDL: 52 mg/dL (ref >39)
LDL Cholesterol: 167 mg/dL — ABNORMAL HIGH (ref 0–99)
TRIGLYCERIDES: 72 mg/dL (ref <150)
VLDL: 14 mg/dL (ref 0–40)

## 2013-09-13 LAB — COMPREHENSIVE METABOLIC PANEL
ALBUMIN: 4.5 g/dL (ref 3.5–5.2)
ALT: 22 U/L (ref 0–53)
AST: 18 U/L (ref 0–37)
Alkaline Phosphatase: 41 U/L (ref 39–117)
BUN: 20 mg/dL (ref 6–23)
CALCIUM: 9.8 mg/dL (ref 8.4–10.5)
CHLORIDE: 107 meq/L (ref 96–112)
CO2: 27 mEq/L (ref 19–32)
CREATININE: 1.11 mg/dL (ref 0.50–1.35)
GLUCOSE: 107 mg/dL — AB (ref 70–99)
Potassium: 4.5 mEq/L (ref 3.5–5.3)
Sodium: 143 mEq/L (ref 135–145)
Total Bilirubin: 0.6 mg/dL (ref 0.2–1.2)
Total Protein: 6.9 g/dL (ref 6.0–8.3)

## 2013-09-13 LAB — PSA: PSA: 1.06 ng/mL (ref <=4.00)

## 2013-09-19 ENCOUNTER — Encounter: Payer: Self-pay | Admitting: Family Medicine

## 2013-09-19 ENCOUNTER — Ambulatory Visit (INDEPENDENT_AMBULATORY_CARE_PROVIDER_SITE_OTHER): Payer: No Typology Code available for payment source | Admitting: Family Medicine

## 2013-09-19 VITALS — BP 126/78 | HR 78 | Temp 97.2°F | Resp 16 | Ht 70.0 in | Wt 193.0 lb

## 2013-09-19 DIAGNOSIS — G589 Mononeuropathy, unspecified: Secondary | ICD-10-CM

## 2013-09-19 DIAGNOSIS — Z23 Encounter for immunization: Secondary | ICD-10-CM

## 2013-09-19 DIAGNOSIS — G629 Polyneuropathy, unspecified: Secondary | ICD-10-CM

## 2013-09-19 DIAGNOSIS — Z0000 Encounter for general adult medical examination without abnormal findings: Secondary | ICD-10-CM

## 2013-09-19 LAB — TSH: TSH: 2.682 u[IU]/mL (ref 0.350–4.500)

## 2013-09-19 LAB — VITAMIN B12: Vitamin B-12: 1449 pg/mL — ABNORMAL HIGH (ref 211–911)

## 2013-09-19 MED ORDER — PRAVASTATIN SODIUM 40 MG PO TABS: 40.0000 mg | ORAL_TABLET | Freq: Every day | ORAL | Status: DC

## 2013-09-19 MED ORDER — GABAPENTIN 300 MG PO CAPS: 300.0000 mg | ORAL_CAPSULE | Freq: Every day | ORAL | Status: DC

## 2013-09-19 NOTE — Progress Notes (Signed)
Subjective:    Patient ID: Joseph Tucker, male    DOB: [birth year], 58 y.o.   MRN: [id number]  HPI  Patient is here today for complete physical exam. His only concern is that he complains of burning stinging pain in both feet after working hard all day long.  He denies any back pain. He denies any sciatica or symptoms of cauda equina syndrome. Otherwise he is doing well. He refuses a flu vaccine. He is due for a tetanus vaccine. His colonoscopy was in 2012 and is due again in 2017. His prostate exam is due today. His most recent labwork as listed below: Lab on 09/13/2013  Component Date Value Ref Range Status  . WBC 09/13/2013 5.8  4.0 - 10.5 K/uL Final  . RBC 09/13/2013 5.03  4.22 - 5.81 MIL/uL Final  . Hemoglobin 09/13/2013 14.9  13.0 - 17.0 g/dL Final  . HCT 09/13/2013 42.0  39.0 - 52.0 % Final  . MCV 09/13/2013 83.5  78.0 - 100.0 fL Final  . MCH 09/13/2013 29.6  26.0 - 34.0 pg Final  . MCHC 09/13/2013 35.5  30.0 - 36.0 g/dL Final  . RDW 09/13/2013 14.1  11.5 - 15.5 % Final  . Platelets 09/13/2013 271  150 - 400 K/uL Final  . Neutrophils Relative % 09/13/2013 65  43 - 77 % Final  . Neutro Abs 09/13/2013 3.7  1.7 - 7.7 K/uL Final  . Lymphocytes Relative 09/13/2013 25  12 - 46 % Final  . Lymphs Abs 09/13/2013 1.4  0.7 - 4.0 K/uL Final  . Monocytes Relative 09/13/2013 8  3 - 12 % Final  . Monocytes Absolute 09/13/2013 0.5  0.1 - 1.0 K/uL Final  . Eosinophils Relative 09/13/2013 1  0 - 5 % Final  . Eosinophils Absolute 09/13/2013 0.1  0.0 - 0.7 K/uL Final  . Basophils Relative 09/13/2013 1  0 - 1 % Final  . Basophils Absolute 09/13/2013 0.1  0.0 - 0.1 K/uL Final  . Smear Review 09/13/2013 Criteria for review not met   Final  . Sodium 09/13/2013 143  135 - 145 mEq/L Final  . Potassium 09/13/2013 4.5  3.5 - 5.3 mEq/L Final  . Chloride 09/13/2013 107  96 - 112 mEq/L Final  . CO2 09/13/2013 27  19 - 32 mEq/L Final  . Glucose, Bld 09/13/2013 107* 70 - 99 mg/dL Final  . BUN 09/13/2013  20  6 - 23 mg/dL Final  . Creat 09/13/2013 1.11  0.50 - 1.35 mg/dL Final  . Total Bilirubin 09/13/2013 0.6  0.2 - 1.2 mg/dL Final   ** Please note change in reference range(s). **  . Alkaline Phosphatase 09/13/2013 41  39 - 117 U/L Final  . AST 09/13/2013 18  0 - 37 U/L Final  . ALT 09/13/2013 22  0 - 53 U/L Final  . Total Protein 09/13/2013 6.9  6.0 - 8.3 g/dL Final  . Albumin 09/13/2013 4.5  3.5 - 5.2 g/dL Final  . Calcium 09/13/2013 9.8  8.4 - 10.5 mg/dL Final  . Cholesterol 09/13/2013 233* 0 - 200 mg/dL Final   Comment: ATP III Classification:                                < 200        mg/dL        Desirable  200 - 239     mg/dL        Borderline High                               >= 240        mg/dL        High                             . Triglycerides 09/13/2013 72  <150 mg/dL Final  . HDL 09/13/2013 52  >39 mg/dL Final  . Total CHOL/HDL Ratio 09/13/2013 4.5   Final  . VLDL 09/13/2013 14  0 - 40 mg/dL Final  . LDL Cholesterol 09/13/2013 167* 0 - 99 mg/dL Final   Comment:                            Total Cholesterol/HDL Ratio:CHD Risk                                                 Coronary Heart Disease Risk Table                                                                 Men       Women                                   1/2 Average Risk              3.4        3.3                                       Average Risk              5.0        4.4                                    2X Average Risk              9.6        7.1                                    3X Average Risk             23.4       11.0                          Use the calculated Patient Ratio above and the CHD Risk table  to determine the patient's CHD Risk.                          ATP III Classification (LDL):                                < 100        mg/dL         Optimal                               100 - 129     mg/dL         Near or Above Optimal                                130 - 159     mg/dL         Borderline High                               160 - 189     mg/dL         High                                > 190        mg/dL         Very High                             . PSA 09/13/2013 1.06  <=4.00 ng/mL Final   Comment: Test Methodology: ECLIA PSA (Electrochemiluminescence Immunoassay)                                                     For PSA values from 2.5-4.0, particularly in younger men <60 years                          old, the AUA and NCCN suggest testing for % Free PSA (3515) and                          evaluation of the rate of increase in PSA (PSA velocity).   Current Outpatient Prescriptions on File Prior to Visit  Medication Sig Dispense Refill  . aspirin 81 MG tablet Take 81 mg by mouth daily.        Marland Kitchen Fexofenadine HCl (ALLEGRA PO) Take by mouth 1 dose over 46 hours.        . fish oil-omega-3 fatty acids 1000 MG capsule Take 2 g by mouth daily.      . Probiotic Product (PROBIOTIC DAILY PO) Take by mouth.      . vitamin B-12 (CYANOCOBALAMIN) 1000 MCG tablet Take 1,000 mcg by mouth daily.       Current Facility-Administered Medications on File Prior to Visit  Medication Dose Route Frequency Provider Last Rate Last Dose  . 0.9 %  sodium chloride infusion  500 mL Intravenous Continuous  Sable Feil, MD      . cyanocobalamin ((VITAMIN B-12)) injection 1,000 mcg  1,000 mcg Intramuscular Q30 days Sable Feil, MD   1,000 mcg at 08/11/11 7616   Allergies  Allergen Reactions  . Codeine Other (See Comments)    Nausea and passing out  . Penicillins    History   Social History  . Marital Status: Married    Spouse Name: N/A    Number of Children: N/A  . Years of Education: N/A   Occupational History  . lineman    Social History Main Topics  . Smoking status: Never Smoker   . Smokeless tobacco: Not on file  . Alcohol Use: No  . Drug Use: No  . Sexual Activity: Yes     Comment: married, owns  an Hydrolog[employer]   Other Topics Concern  . Not on file   Social History Narrative  . No narrative on file   Family History  Problem Relation Age of Onset  . Colon cancer Father   . Colon polyps Father   . Heart disease Father   . Cancer Father     colon  . Stomach cancer Mother   . Cancer Mother     brain  . Leukemia Sister   . Cancer Sister     leukemia     Review of Systems  All other systems reviewed and are negative.       Objective:   Physical Exam  Vitals reviewed. Constitutional: He is oriented to person, place, and time. He appears well-developed and well-nourished. No distress.  HENT:  Head: Normocephalic and atraumatic.  Right Ear: External ear normal.  Left Ear: External ear normal.  Nose: Nose normal.  Mouth/Throat: Oropharynx is clear and moist. No oropharyngeal exudate.  Eyes: Conjunctivae and EOM are normal. Pupils are equal, round, and reactive to light. Right eye exhibits no discharge. Left eye exhibits no discharge. No scleral icterus.  Neck: Normal range of motion. Neck supple. No JVD present. No tracheal deviation present. No thyromegaly present.  Cardiovascular: Normal rate, regular rhythm, normal heart sounds and intact distal pulses.  Exam reveals no gallop and no friction rub.   No murmur heard. Pulmonary/Chest: Effort normal and breath sounds normal. No stridor. No respiratory distress. He has no wheezes. He has no rales. He exhibits no tenderness.  Abdominal: Soft. Bowel sounds are normal. He exhibits no distension and no mass. There is no tenderness. There is no rebound and no guarding.  Genitourinary: Rectum normal, prostate normal and penis normal. Guaiac negative stool. No penile tenderness.  Musculoskeletal: Normal range of motion. He exhibits no edema and no tenderness.  Lymphadenopathy:    He has no cervical adenopathy.  Neurological: He is alert and oriented to person, place, and time. He has normal reflexes. He  displays normal reflexes. No cranial nerve deficit. He exhibits normal muscle tone. Coordination normal.  Skin: Skin is warm. No rash noted. He is not diaphoretic. No erythema. No pallor.  Psychiatric: He has a normal mood and affect. His behavior is normal. Judgment and thought content normal.          Assessment & Plan:  1. Routine general medical examination at a health care facility Patient received his tetanus vaccine today in clinic. His other cancer screening is up to date. His other immunizations are up-to-date. Start patient on pravastatin 40 mg by mouth daily for hyperlipidemia and recommended that we recheck a CMP and fasting lipid panel again in  3 months. He like to try gabapentin 300 mg by mouth each bedtime as needed for neuropathy. Also check reversible causes of neuropathy including a TSH and vitamin B12.  We had a long discussion about low fat diet as well as low carbohydrate diets to address his hyperglycemia. - Tdap vaccine greater than or equal to 7yo IM  2. Neuropathy - Vitamin B12 - TSH

## 2013-09-19 NOTE — Addendum Note (Signed)
Addended by: WRAY, [place] on: 09/19/2013 09:03 AM   Modules accepted: Orders

## 2013-09-21 ENCOUNTER — Encounter: Payer: Self-pay | Admitting: Family Medicine

## 2013-11-19 ENCOUNTER — Encounter: Payer: Self-pay | Admitting: Gastroenterology

## 2013-12-17 ENCOUNTER — Ambulatory Visit: Payer: No Typology Code available for payment source | Admitting: Family Medicine

## 2014-01-10 ENCOUNTER — Other Ambulatory Visit: Payer: No Typology Code available for payment source

## 2014-01-10 DIAGNOSIS — E785 Hyperlipidemia, unspecified: Secondary | ICD-10-CM

## 2014-01-10 DIAGNOSIS — Z79899 Other long term (current) drug therapy: Secondary | ICD-10-CM

## 2014-01-10 LAB — COMPLETE METABOLIC PANEL WITH GFR
ALT: 36 U/L (ref 0–53)
AST: 24 U/L (ref 0–37)
Albumin: 4.2 g/dL (ref 3.5–5.2)
Alkaline Phosphatase: 31 U/L — ABNORMAL LOW (ref 39–117)
BILIRUBIN TOTAL: 0.8 mg/dL (ref 0.2–1.2)
BUN: 13 mg/dL (ref 6–23)
CALCIUM: 9.1 mg/dL (ref 8.4–10.5)
CO2: 24 meq/L (ref 19–32)
CREATININE: 1.18 mg/dL (ref 0.50–1.35)
Chloride: 106 mEq/L (ref 96–112)
GFR, EST AFRICAN AMERICAN: 78 mL/min
GFR, Est Non African American: 68 mL/min
Glucose, Bld: 113 mg/dL — ABNORMAL HIGH (ref 70–99)
Potassium: 4.5 mEq/L (ref 3.5–5.3)
Sodium: 142 mEq/L (ref 135–145)
Total Protein: 6.5 g/dL (ref 6.0–8.3)

## 2014-01-10 LAB — LIPID PANEL
Cholesterol: 146 mg/dL (ref 0–200)
HDL: 38 mg/dL — AB (ref >39)
LDL Cholesterol: 89 mg/dL (ref 0–99)
Total CHOL/HDL Ratio: 3.8 Ratio
Triglycerides: 96 mg/dL (ref <150)
VLDL: 19 mg/dL (ref 0–40)

## 2014-01-13 ENCOUNTER — Encounter: Payer: Self-pay | Admitting: Family Medicine

## 2014-01-13 ENCOUNTER — Ambulatory Visit (INDEPENDENT_AMBULATORY_CARE_PROVIDER_SITE_OTHER): Payer: No Typology Code available for payment source | Admitting: Family Medicine

## 2014-01-13 VITALS — BP 120/80 | HR 78 | Temp 97.9°F | Resp 14 | Ht 70.0 in | Wt 196.0 lb

## 2014-01-13 DIAGNOSIS — G589 Mononeuropathy, unspecified: Secondary | ICD-10-CM

## 2014-01-13 DIAGNOSIS — E785 Hyperlipidemia, unspecified: Secondary | ICD-10-CM

## 2014-01-13 DIAGNOSIS — G629 Polyneuropathy, unspecified: Secondary | ICD-10-CM

## 2014-01-13 MED ORDER — GABAPENTIN 300 MG PO CAPS: 300.0000 mg | ORAL_CAPSULE | Freq: Every day | ORAL | Status: DC

## 2014-01-13 MED ORDER — PRAVASTATIN SODIUM 20 MG PO TABS: 40.0000 mg | ORAL_TABLET | Freq: Every day | ORAL | Status: DC

## 2014-01-13 NOTE — Progress Notes (Signed)
Subjective:    Patient ID: Joseph Tucker, male    DOB: 1956-06-16, 58 y.o.   MRN: [id number]  HPI Since starting the gabapentin for neuropathy in his feet, the neuropathy has resolved. He is very satisfied with how well the gabapentin is working.  He denies any side effects from the gabapentin.  At his last office visit, his LDL cholesterol was 167. I started the patient on pravastatin 40 mg by mouth daily for hyperlipidemia. He denies any right upper quadrant abdominal pain and myalgias. His most recent labwork as listed below. Unfortunately his fasting blood sugar has risen from 107-113 since starting the statin: Lab on 01/10/2014  Component Date Value Ref Range Status  . Cholesterol 01/10/2014 146  0 - 200 mg/dL Final   Comment: ATP III Classification:                                < 200        mg/dL        Desirable                               200 - 239     mg/dL        Borderline High                               >= 240        mg/dL        High                             . Triglycerides 01/10/2014 96  <150 mg/dL Final  . HDL 01/10/2014 38* >39 mg/dL Final  . Total CHOL/HDL Ratio 01/10/2014 3.8   Final  . VLDL 01/10/2014 19  0 - 40 mg/dL Final  . LDL Cholesterol 01/10/2014 89  0 - 99 mg/dL Final   Comment:                            Total Cholesterol/HDL Ratio:CHD Risk                                                 Coronary Heart Disease Risk Table                                                                 Men       Women                                   1/2 Average Risk              3.4        3.3  Average Risk              5.0        4.4                                    2X Average Risk              9.6        7.1                                    3X Average Risk             23.4       11.0                          Use the calculated Patient Ratio above and the CHD Risk table                           to determine the  patient's CHD Risk.                          ATP III Classification (LDL):                                < 100        mg/dL         Optimal                               100 - 129     mg/dL         Near or Above Optimal                               130 - 159     mg/dL         Borderline High                               160 - 189     mg/dL         High                                > 190        mg/dL         Very High                             . Sodium 01/10/2014 142  135 - 145 mEq/L Final  . Potassium 01/10/2014 4.5  3.5 - 5.3 mEq/L Final  . Chloride 01/10/2014 106  96 - 112 mEq/L Final  . CO2 01/10/2014 24  19 - 32 mEq/L Final  . Glucose, Bld 01/10/2014 113* 70 - 99 mg/dL Final  . BUN 01/10/2014 13  6 - 23 mg/dL Final  . Creat 01/10/2014 1.18  0.50 - 1.35 mg/dL Final  . Total Bilirubin 01/10/2014 0.8  0.2 - 1.2  mg/dL Final  . Alkaline Phosphatase 01/10/2014 31* 39 - 117 U/L Final  . AST 01/10/2014 24  0 - 37 U/L Final  . ALT 01/10/2014 36  0 - 53 U/L Final  . Total Protein 01/10/2014 6.5  6.0 - 8.3 g/dL Final  . Albumin 01/10/2014 4.2  3.5 - 5.2 g/dL Final  . Calcium 01/10/2014 9.1  8.4 - 10.5 mg/dL Final  . GFR, Est African American 01/10/2014 78   Final  . GFR, Est Non African American 01/10/2014 68   Final   Comment:                            The estimated GFR is a calculation valid for adults (>=88 years old)                          that uses the CKD-EPI algorithm to adjust for age and sex. It is                            not to be used for children, pregnant women, hospitalized patients,                             patients on dialysis, or with rapidly changing kidney function.                          According to the NKDEP, eGFR >89 is normal, 60-89 shows mild                          impairment, 30-59 shows moderate impairment, 15-29 shows severe                          impairment and <15 is ESRD.                              Past Medical History  Diagnosis Date   . Colon polyps   . Kidney stones   . Pneumonia   . GERD (gastroesophageal reflux disease)   . Hyperlipidemia    Current Outpatient Prescriptions on File Prior to Visit  Medication Sig Dispense Refill  . aspirin 81 MG tablet Take 81 mg by mouth daily.        Marland Kitchen Fexofenadine HCl (ALLEGRA PO) Take by mouth 1 dose over 46 hours.        . fish oil-omega-3 fatty acids 1000 MG capsule Take 2 g by mouth daily.      . Probiotic Product (PROBIOTIC DAILY PO) Take by mouth.      . vitamin B-12 (CYANOCOBALAMIN) 1000 MCG tablet Take 1,000 mcg by mouth daily.       Current Facility-Administered Medications on File Prior to Visit  Medication Dose Route Frequency Provider Last Rate Last Dose  . 0.9 %  sodium chloride infusion  500 mL Intravenous Continuous Sable Feil, MD      . cyanocobalamin ((VITAMIN B-12)) injection 1,000 mcg  1,000 mcg Intramuscular Q30 days Sable Feil, MD   1,000 mcg at 08/11/11 4920   Allergies  Allergen Reactions  . Codeine Other (See Comments)    Nausea and passing out  . Penicillins  History   Social History  . Marital Status: Married    Spouse Name: N/A    Number of Children: N/A  . Years of Education: N/A   Occupational History  . lineman    Social History Main Topics  . Smoking status: Never Smoker   . Smokeless tobacco: Not on file  . Alcohol Use: No  . Drug Use: No  . 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 Not on file   Social History Narrative  . No narrative on file      Review of Systems     Objective:   Physical Exam  Vitals reviewed. Cardiovascular: Normal rate, regular rhythm and normal heart sounds.   Pulmonary/Chest: Effort normal and breath sounds normal. No respiratory distress. He has no wheezes. He has no rales.  Abdominal: Soft. Bowel sounds are normal. He exhibits no distension. There is no tenderness. There is no rebound and no guarding.           Assessment & Plan:  1. HLD (hyperlipidemia) Decrease pravastatin 20 mg by mouth daily. Cholesterol is excellent. Hopefully this will mitigate the side effects of his blood sugar  2. Neuropathy Continue gabapentin 300 mg by mouth each bedtime.  Recheck PSA in August.

## 2014-01-14 ENCOUNTER — Ambulatory Visit: Payer: No Typology Code available for payment source | Admitting: Family Medicine

## 2014-04-28 ENCOUNTER — Encounter: Payer: Self-pay | Admitting: Gastroenterology

## 2014-08-13 ENCOUNTER — Other Ambulatory Visit: Payer: Self-pay | Admitting: Family Medicine

## 2014-08-17 ENCOUNTER — Other Ambulatory Visit: Payer: Self-pay | Admitting: Family Medicine

## 2014-09-23 ENCOUNTER — Other Ambulatory Visit: Payer: Self-pay | Admitting: Family Medicine

## 2014-10-20 ENCOUNTER — Other Ambulatory Visit: Payer: Self-pay | Admitting: Family Medicine

## 2014-10-20 NOTE — Telephone Encounter (Signed)
Medication refilled per protocol. 

## 2014-11-18 ENCOUNTER — Telehealth: Payer: Self-pay | Admitting: Family Medicine

## 2014-11-18 NOTE — Telephone Encounter (Signed)
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

## 2014-11-19 ENCOUNTER — Encounter: Payer: Self-pay | Admitting: Family Medicine

## 2014-11-19 ENCOUNTER — Other Ambulatory Visit: Payer: Self-pay | Admitting: Family Medicine

## 2014-11-19 NOTE — Telephone Encounter (Signed)
Pt aware and letter left up front

## 2014-11-19 NOTE — Telephone Encounter (Signed)
Letter is on my desk

## 2014-12-03 ENCOUNTER — Other Ambulatory Visit: Payer: 59

## 2014-12-03 ENCOUNTER — Other Ambulatory Visit: Payer: Self-pay | Admitting: Family Medicine

## 2014-12-03 DIAGNOSIS — G629 Polyneuropathy, unspecified: Secondary | ICD-10-CM

## 2014-12-03 DIAGNOSIS — Z0000 Encounter for general adult medical examination without abnormal findings: Secondary | ICD-10-CM

## 2014-12-03 DIAGNOSIS — E785 Hyperlipidemia, unspecified: Secondary | ICD-10-CM

## 2014-12-03 DIAGNOSIS — Z79899 Other long term (current) drug therapy: Secondary | ICD-10-CM

## 2014-12-03 DIAGNOSIS — Z125 Encounter for screening for malignant neoplasm of prostate: Secondary | ICD-10-CM

## 2014-12-03 LAB — CBC WITH DIFFERENTIAL/PLATELET
BASOS ABS: 0.1 10*3/uL (ref 0.0–0.1)
BASOS PCT: 1 % (ref 0–1)
Eosinophils Absolute: 0.2 10*3/uL (ref 0.0–0.7)
Eosinophils Relative: 3 % (ref 0–5)
HEMATOCRIT: 41.8 % (ref 39.0–52.0)
HEMOGLOBIN: 13.7 g/dL (ref 13.0–17.0)
LYMPHS PCT: 30 % (ref 12–46)
Lymphs Abs: 1.7 10*3/uL (ref 0.7–4.0)
MCH: 28.6 pg (ref 26.0–34.0)
MCHC: 32.8 g/dL (ref 30.0–36.0)
MCV: 87.3 fL (ref 78.0–100.0)
MPV: 9.5 fL (ref 8.6–12.4)
Monocytes Absolute: 0.4 10*3/uL (ref 0.1–1.0)
Monocytes Relative: 7 % (ref 3–12)
NEUTROS PCT: 59 % (ref 43–77)
Neutro Abs: 3.2 10*3/uL (ref 1.7–7.7)
PLATELETS: 247 10*3/uL (ref 150–400)
RBC: 4.79 MIL/uL (ref 4.22–5.81)
RDW: 14.2 % (ref 11.5–15.5)
WBC: 5.5 10*3/uL (ref 4.0–10.5)

## 2014-12-03 LAB — LIPID PANEL
Cholesterol: 137 mg/dL (ref 0–200)
HDL: 40 mg/dL (ref >=40)
LDL Cholesterol: 84 mg/dL (ref 0–99)
Total CHOL/HDL Ratio: 3.4 Ratio
Triglycerides: 67 mg/dL (ref <150)
VLDL: 13 mg/dL (ref 0–40)

## 2014-12-03 LAB — COMPLETE METABOLIC PANEL WITH GFR
ALBUMIN: 4.2 g/dL (ref 3.5–5.2)
ALK PHOS: 34 U/L — AB (ref 39–117)
ALT: 27 U/L (ref 0–53)
AST: 17 U/L (ref 0–37)
BUN: 17 mg/dL (ref 6–23)
CO2: 22 meq/L (ref 19–32)
Calcium: 9 mg/dL (ref 8.4–10.5)
Chloride: 110 mEq/L (ref 96–112)
Creat: 1.11 mg/dL (ref 0.50–1.35)
GFR, EST AFRICAN AMERICAN: 84 mL/min
GFR, EST NON AFRICAN AMERICAN: 72 mL/min
GLUCOSE: 120 mg/dL — AB (ref 70–99)
POTASSIUM: 4.1 meq/L (ref 3.5–5.3)
Sodium: 146 mEq/L — ABNORMAL HIGH (ref 135–145)
Total Bilirubin: 0.7 mg/dL (ref 0.2–1.2)
Total Protein: 6.5 g/dL (ref 6.0–8.3)

## 2014-12-03 LAB — TSH: TSH: 1.603 u[IU]/mL (ref 0.350–4.500)

## 2014-12-04 LAB — PSA: PSA: 2.2 ng/mL (ref <=4.00)

## 2014-12-05 LAB — HEMOGLOBIN A1C
Hgb A1c MFr Bld: 6.5 % — ABNORMAL HIGH (ref <5.7)
Mean Plasma Glucose: 140 mg/dL — ABNORMAL HIGH (ref <117)

## 2014-12-22 ENCOUNTER — Ambulatory Visit (INDEPENDENT_AMBULATORY_CARE_PROVIDER_SITE_OTHER): Payer: 59 | Admitting: Family Medicine

## 2014-12-22 ENCOUNTER — Encounter: Payer: Self-pay | Admitting: Family Medicine

## 2014-12-22 VITALS — BP 140/82 | HR 78 | Temp 98.2°F | Resp 16 | Ht 70.0 in | Wt 200.0 lb

## 2014-12-22 DIAGNOSIS — D485 Neoplasm of uncertain behavior of skin: Secondary | ICD-10-CM

## 2014-12-22 DIAGNOSIS — Z0000 Encounter for general adult medical examination without abnormal findings: Secondary | ICD-10-CM

## 2014-12-22 MED ORDER — ZOSTER VACCINE LIVE 19400 UNT/0.65ML ~~LOC~~ SOLR: 0.6500 mL | Freq: Once | SUBCUTANEOUS | Status: DC

## 2014-12-22 NOTE — Progress Notes (Signed)
Subjective:    Patient ID: Joseph Tucker, male    DOB: 01/23/1956, 59 y.o.   MRN: [id number]  HPI  Here today for CPE.  Last colonoscopy was 2012 and repeat colonoscopy was recommended in 2022.  He is just about to turn 59. He would like a shingles vaccine so we have sent this to his pharmacy. He has noticed a spot behind his left ear. It is approximately 4 mm in size. It is pearly and iridescent. There is a slight central depression. It is concerning for a possible basal cell carcinoma. He would like to see his dermatologist. I will gladly facilitate this. Most recent labs are listed below: Lab on 12/03/2014  Component Date Value Ref Range Status  . Sodium 12/03/2014 146* 135 - 145 mEq/L Final  . Potassium 12/03/2014 4.1  3.5 - 5.3 mEq/L Final  . Chloride 12/03/2014 110  96 - 112 mEq/L Final  . CO2 12/03/2014 22  19 - 32 mEq/L Final  . Glucose, Bld 12/03/2014 120* 70 - 99 mg/dL Final  . BUN 12/03/2014 17  6 - 23 mg/dL Final  . Creat 12/03/2014 1.11  0.50 - 1.35 mg/dL Final  . Total Bilirubin 12/03/2014 0.7  0.2 - 1.2 mg/dL Final  . Alkaline Phosphatase 12/03/2014 34* 39 - 117 U/L Final  . AST 12/03/2014 17  0 - 37 U/L Final  . ALT 12/03/2014 27  0 - 53 U/L Final  . Total Protein 12/03/2014 6.5  6.0 - 8.3 g/dL Final  . Albumin 12/03/2014 4.2  3.5 - 5.2 g/dL Final  . Calcium 12/03/2014 9.0  8.4 - 10.5 mg/dL Final  . GFR, Est African American 12/03/2014 84   Final  . GFR, Est Non African American 12/03/2014 72   Final   Comment:   The estimated GFR is a calculation valid for adults (>=48 years old) that uses the CKD-EPI algorithm to adjust for age and sex. It is   not to be used for children, pregnant women, hospitalized patients,    patients on dialysis, or with rapidly changing kidney function. According to the NKDEP, eGFR >89 is normal, 60-89 shows mild impairment, 30-59 shows moderate impairment, 15-29 shows severe impairment and <15 is ESRD.     . TSH 12/03/2014 1.603   0.350 - 4.500 uIU/mL Final  . Cholesterol 12/03/2014 137  0 - 200 mg/dL Final   Comment: ATP III Classification:       < 200        mg/dL        Desirable      200 - 239     mg/dL        Borderline High      >= 240        mg/dL        High     . Triglycerides 12/03/2014 67  <150 mg/dL Final  . HDL 12/03/2014 40  >=40 mg/dL Final   ** Please note change in reference range(s). **  . Total CHOL/HDL Ratio 12/03/2014 3.4   Final  . VLDL 12/03/2014 13  0 - 40 mg/dL Final  . LDL Cholesterol 12/03/2014 84  0 - 99 mg/dL Final   Comment:   Total Cholesterol/HDL Ratio:CHD Risk                        Coronary Heart Disease Risk Table  Men       Women          1/2 Average Risk              3.4        3.3              Average Risk              5.0        4.4           2X Average Risk              9.6        7.1           3X Average Risk             23.4       11.0 Use the calculated Patient Ratio above and the CHD Risk table  to determine the patient's CHD Risk. ATP III Classification (LDL):       < 100        mg/dL         Optimal      100 - 129     mg/dL         Near or Above Optimal      130 - 159     mg/dL         Borderline High      160 - 189     mg/dL         High       > 190        mg/dL         Very High     . WBC 12/03/2014 5.5  4.0 - 10.5 K/uL Final  . RBC 12/03/2014 4.79  4.22 - 5.81 MIL/uL Final  . Hemoglobin 12/03/2014 13.7  13.0 - 17.0 g/dL Final  . HCT 12/03/2014 41.8  39.0 - 52.0 % Final  . MCV 12/03/2014 87.3  78.0 - 100.0 fL Final  . MCH 12/03/2014 28.6  26.0 - 34.0 pg Final  . MCHC 12/03/2014 32.8  30.0 - 36.0 g/dL Final  . RDW 12/03/2014 14.2  11.5 - 15.5 % Final  . Platelets 12/03/2014 247  150 - 400 K/uL Final  . MPV 12/03/2014 9.5  8.6 - 12.4 fL Final  . Neutrophils Relative % 12/03/2014 59  43 - 77 % Final  . Neutro Abs 12/03/2014 3.2  1.7 - 7.7 K/uL Final  . Lymphocytes Relative 12/03/2014 30  12 - 46 % Final  . Lymphs Abs  12/03/2014 1.7  0.7 - 4.0 K/uL Final  . Monocytes Relative 12/03/2014 7  3 - 12 % Final  . Monocytes Absolute 12/03/2014 0.4  0.1 - 1.0 K/uL Final  . Eosinophils Relative 12/03/2014 3  0 - 5 % Final  . Eosinophils Absolute 12/03/2014 0.2  0.0 - 0.7 K/uL Final  . Basophils Relative 12/03/2014 1  0 - 1 % Final  . Basophils Absolute 12/03/2014 0.1  0.0 - 0.1 K/uL Final  . Smear Review 12/03/2014 Criteria for review not met   Final  . PSA 12/03/2014 2.20  <=4.00 ng/mL Final   Comment: Test Methodology: ECLIA PSA (Electrochemiluminescence Immunoassay)   For PSA values from 2.5-4.0, particularly in younger men <66 years old, the AUA and NCCN suggest testing for % Free PSA (3515) and evaluation of the rate of increase in PSA (PSA velocity).   Orders Only on 12/03/2014  Component  Date Value Ref Range Status  . Hgb A1c MFr Bld 12/03/2014 6.5* <5.7 % Final   Comment:                                                                        According to the ADA Clinical Practice Recommendations for 2011, when HbA1c is used as a screening test:     >=6.5%   Diagnostic of Diabetes Mellitus            (if abnormal result is confirmed)   5.7-6.4%   Increased risk of developing Diabetes Mellitus   References:Diagnosis and Classification of Diabetes Mellitus,Diabetes LNLG,9211,94(RDEYC 1):S62-S69 and Standards of Medical Care in         Diabetes - 2011,Diabetes XKGY,1856,31 (Suppl 1):S11-S61.     . Mean Plasma Glucose 12/03/2014 140* <117 mg/dL Final    Past Medical History  Diagnosis Date  . Colon polyps   . Kidney stones   . Pneumonia   . GERD (gastroesophageal reflux disease)   . Hyperlipidemia    Past Surgical History  Procedure Laterality Date  . Hernia repair  1979  . Neck surgery    . Knee surgery      RIGHT KNEE  . Lithotripsy  08 & 09  . Cervical spine surgery    . Arthroscopic repair acl     Current Outpatient Prescriptions on File Prior to Visit  Medication Sig Dispense  Refill  . aspirin 81 MG tablet Take 81 mg by mouth daily.      Marland Kitchen Fexofenadine HCl (ALLEGRA PO) Take by mouth 1 dose over 46 hours.      . fish oil-omega-3 fatty acids 1000 MG capsule Take 2 g by mouth daily.    Marland Kitchen gabapentin (NEURONTIN) 300 MG capsule TAKE 1 CAPSULE (300 MG TOTAL) BY MOUTH AT BEDTIME. 90 capsule 0  . pravastatin (PRAVACHOL) 20 MG tablet TAKE 2 TABLETS (40 MG TOTAL) BY MOUTH DAILY. 180 tablet 0  . Probiotic Product (PROBIOTIC DAILY PO) Take by mouth.    . vitamin B-12 (CYANOCOBALAMIN) 1000 MCG tablet Take 1,000 mcg by mouth daily.     Current Facility-Administered Medications on File Prior to Visit  Medication Dose Route Frequency Provider Last Rate Last Dose  . 0.9 %  sodium chloride infusion  500 mL Intravenous Continuous Sable Feil, MD      . cyanocobalamin ((VITAMIN B-12)) injection 1,000 mcg  1,000 mcg Intramuscular Q30 days Sable Feil, MD   1,000 mcg at 08/11/11 4970   Allergies  Allergen Reactions  . Codeine Other (See Comments)    Nausea and passing out  . Penicillins    History   Social History  . Marital Status: Married    Spouse Name: N/A  . Number of Children: N/A  . Years of Education: N/A   Occupational History  . lineman    Social History Main Topics  . Smoking status: Never Smoker   . Smokeless tobacco: Not on file  . Alcohol Use: No  . Drug Use: No  . 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 Not on file   Social History Narrative  . No narrative  on file   Family History  Problem Relation Age of Onset  . Colon cancer Father   . Colon polyps Father   . Heart disease Father   . Cancer Father     colon  . Stomach cancer Mother   . Cancer Mother     brain  . Leukemia Sister   . Cancer Sister     leukemia     Review of Systems  All other systems reviewed and are negative.      Objective:   Physical Exam  Constitutional: He is oriented to  person, place, and time. He appears well-developed and well-nourished. No distress.  HENT:  Head: Normocephalic and atraumatic.  Right Ear: External ear normal.  Left Ear: External ear normal.  Nose: Nose normal.  Mouth/Throat: Oropharynx is clear and moist. No oropharyngeal exudate.  Eyes: Conjunctivae and EOM are normal. Pupils are equal, round, and reactive to light. Right eye exhibits no discharge. Left eye exhibits no discharge. No scleral icterus.  Neck: Normal range of motion. Neck supple. No JVD present. No tracheal deviation present. No thyromegaly present.  Cardiovascular: Normal rate, regular rhythm, normal heart sounds and intact distal pulses.  Exam reveals no gallop and no friction rub.   No murmur heard. Pulmonary/Chest: Effort normal and breath sounds normal. No stridor. No respiratory distress. He has no wheezes. He has no rales.  Abdominal: Soft. Bowel sounds are normal. He exhibits no distension and no mass. There is no tenderness. There is no rebound and no guarding.  Genitourinary: Rectum normal, prostate normal and penis normal.  Musculoskeletal: Normal range of motion. He exhibits no edema or tenderness.  Lymphadenopathy:    He has no cervical adenopathy.  Neurological: He is alert and oriented to person, place, and time. He has normal reflexes. He displays normal reflexes. No cranial nerve deficit. He exhibits normal muscle tone. Coordination normal.  Skin: Skin is warm. No rash noted. He is not diaphoretic. No erythema. No pallor.  Psychiatric: He has a normal mood and affect. His behavior is normal. Judgment and thought content normal.  Vitals reviewed.         Assessment & Plan:  Routine general medical examination at a health care facility  PSA has risen and I would like to recheck this again in 6 months. Prostate exam today is completely normal. I would like to just follow up sooner to make sure we are not catching a trend for a rapidly rising PSA. I had a  long discussion about the patient's blood sugar. He is a borderline diabetic. We discussed low carbohydrate diet, exercise, and weight loss. I recommended less than 45 g of carbs per meal. Recheck in 6 months. Try to lose 5-10 pounds. Discontinue soft drinks and sweet tea. Reduce consumption of bread and starchy foods

## 2014-12-23 ENCOUNTER — Telehealth: Payer: Self-pay | Admitting: Family Medicine

## 2014-12-23 NOTE — Telephone Encounter (Signed)
[phone] Pt wife has called and left a vm stating that they use [facility] That is who he will need to be referred to

## 2014-12-25 NOTE — Telephone Encounter (Signed)
Referral placed to [facility] and wife is aware of appt

## 2015-01-12 ENCOUNTER — Telehealth: Payer: Self-pay | Admitting: *Deleted

## 2015-01-12 NOTE — Telephone Encounter (Signed)
error 

## 2015-01-15 ENCOUNTER — Encounter: Payer: Self-pay | Admitting: Gastroenterology

## 2015-02-04 ENCOUNTER — Telehealth: Payer: Self-pay | Admitting: *Deleted

## 2015-02-04 NOTE — Telephone Encounter (Signed)
Submitted referral thru [facility] compass to Dr. 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

## 2015-05-08 ENCOUNTER — Telehealth: Payer: Self-pay | Admitting: Family Medicine

## 2015-05-08 NOTE — Telephone Encounter (Signed)
Pharmacy request order for pt to receive Zostavax.  Order called to pharmacy.

## 2015-06-26 ENCOUNTER — Ambulatory Visit: Payer: 59 | Admitting: Family Medicine

## 2015-07-01 ENCOUNTER — Other Ambulatory Visit: Payer: 59

## 2015-07-01 DIAGNOSIS — R972 Elevated prostate specific antigen [PSA]: Secondary | ICD-10-CM

## 2015-07-01 DIAGNOSIS — R739 Hyperglycemia, unspecified: Secondary | ICD-10-CM

## 2015-07-01 DIAGNOSIS — Z79899 Other long term (current) drug therapy: Secondary | ICD-10-CM

## 2015-07-01 DIAGNOSIS — E785 Hyperlipidemia, unspecified: Secondary | ICD-10-CM

## 2015-07-01 LAB — COMPLETE METABOLIC PANEL WITH GFR
ALBUMIN: 4.2 g/dL (ref 3.6–5.1)
ALT: 13 U/L (ref 9–46)
AST: 13 U/L (ref 10–35)
Alkaline Phosphatase: 40 U/L (ref 40–115)
BILIRUBIN TOTAL: 0.7 mg/dL (ref 0.2–1.2)
BUN: 16 mg/dL (ref 7–25)
CO2: 26 mmol/L (ref 20–31)
CREATININE: 1.07 mg/dL (ref 0.70–1.25)
Calcium: 9.1 mg/dL (ref 8.6–10.3)
Chloride: 106 mmol/L (ref 98–110)
GFR, EST AFRICAN AMERICAN: 87 mL/min (ref >=60)
GFR, Est Non African American: 75 mL/min (ref >=60)
Glucose, Bld: 100 mg/dL — ABNORMAL HIGH (ref 70–99)
Potassium: 4.3 mmol/L (ref 3.5–5.3)
Sodium: 141 mmol/L (ref 135–146)
TOTAL PROTEIN: 6.8 g/dL (ref 6.1–8.1)

## 2015-07-01 LAB — LIPID PANEL
Cholesterol: 153 mg/dL (ref 125–200)
HDL: 45 mg/dL (ref >=40)
LDL Cholesterol: 99 mg/dL (ref <130)
TRIGLYCERIDES: 45 mg/dL (ref <150)
Total CHOL/HDL Ratio: 3.4 Ratio (ref <=5.0)
VLDL: 9 mg/dL (ref <30)

## 2015-07-01 LAB — HEMOGLOBIN A1C
HEMOGLOBIN A1C: 6.3 % — AB (ref <5.7)
Mean Plasma Glucose: 134 mg/dL — ABNORMAL HIGH (ref <117)

## 2015-07-02 LAB — PSA: PSA: 2.57 ng/mL (ref <=4.00)

## 2015-07-14 ENCOUNTER — Encounter: Payer: Self-pay | Admitting: Family Medicine

## 2015-07-14 ENCOUNTER — Ambulatory Visit (INDEPENDENT_AMBULATORY_CARE_PROVIDER_SITE_OTHER): Payer: 59 | Admitting: Family Medicine

## 2015-07-14 VITALS — BP 134/96 | HR 68 | Temp 98.8°F | Resp 18 | Wt 188.0 lb

## 2015-07-14 DIAGNOSIS — E785 Hyperlipidemia, unspecified: Secondary | ICD-10-CM

## 2015-07-14 DIAGNOSIS — R7303 Prediabetes: Secondary | ICD-10-CM | POA: Diagnosis not present

## 2015-07-14 MED ORDER — LOSARTAN POTASSIUM 50 MG PO TABS: 50.0000 mg | ORAL_TABLET | Freq: Every day | ORAL | Status: DC

## 2015-07-14 MED ORDER — ZOSTER VACCINE LIVE 19400 UNT/0.65ML ~~LOC~~ SOLR: 0.6500 mL | Freq: Once | SUBCUTANEOUS | Status: DC

## 2015-07-14 NOTE — Progress Notes (Signed)
Subjective:    Patient ID: Joseph Tucker, male    DOB: [birth year], 59 y.o.   MRN: [id number]  HPI  Wt Readings from Last 3 Encounters:  07/14/15 188 lb (85.276 kg)  12/22/14 200 lb (90.719 kg)  01/13/14 196 lb (88.905 kg)    Patient is here for follow up of his hld and prediabetes.  We also rechecked a PSA given the increase over the last 12 months. His most recent labs are listed below: Lab on 07/01/2015  Component Date Value Ref Range Status  . Sodium 07/01/2015 141  135 - 146 mmol/L Final  . Potassium 07/01/2015 4.3  3.5 - 5.3 mmol/L Final  . Chloride 07/01/2015 106  98 - 110 mmol/L Final  . CO2 07/01/2015 26  20 - 31 mmol/L Final  . Glucose, Bld 07/01/2015 100* 70 - 99 mg/dL Final  . BUN 07/01/2015 16  7 - 25 mg/dL Final  . Creat 07/01/2015 1.07  0.70 - 1.25 mg/dL Final  . Total Bilirubin 07/01/2015 0.7  0.2 - 1.2 mg/dL Final  . Alkaline Phosphatase 07/01/2015 40  40 - 115 U/L Final  . AST 07/01/2015 13  10 - 35 U/L Final  . ALT 07/01/2015 13  9 - 46 U/L Final  . Total Protein 07/01/2015 6.8  6.1 - 8.1 g/dL Final  . Albumin 07/01/2015 4.2  3.6 - 5.1 g/dL Final  . Calcium 07/01/2015 9.1  8.6 - 10.3 mg/dL Final  . GFR, Est African American 07/01/2015 87  >=60 mL/min Final  . GFR, Est Non African American 07/01/2015 75  >=60 mL/min Final   Comment:   The estimated GFR is a calculation valid for adults (>=12 years old) that uses the CKD-EPI algorithm to adjust for age and sex. It is   not to be used for children, pregnant women, hospitalized patients,    patients on dialysis, or with rapidly changing kidney function. According to the NKDEP, eGFR >89 is normal, 60-89 shows mild impairment, 30-59 shows moderate impairment, 15-29 shows severe impairment and <15 is ESRD.     Marland Kitchen Cholesterol 07/01/2015 153  125 - 200 mg/dL Final  . Triglycerides 07/01/2015 45  <150 mg/dL Final  . HDL 07/01/2015 45  >=40 mg/dL Final  . Total CHOL/HDL Ratio 07/01/2015 3.4  <=5.0 Ratio Final   . VLDL 07/01/2015 9  <30 mg/dL Final  . LDL Cholesterol 07/01/2015 99  <130 mg/dL Final   Comment:   Total Cholesterol/HDL Ratio:CHD Risk                        Coronary Heart Disease Risk Table                                        Men       Women          1/2 Average Risk              3.4        3.3              Average Risk              5.0        4.4           2X Average Risk              9.6  7.1           3X Average Risk             23.4       11.0 Use the calculated Patient Ratio above and the CHD Risk table  to determine the patient's CHD Risk.   . Hgb A1c MFr Bld 07/01/2015 6.3* <5.7 % Final   Comment:                                                                        According to the ADA Clinical Practice Recommendations for 2011, when HbA1c is used as a screening test:     >=6.5%   Diagnostic of Diabetes Mellitus            (if abnormal result is confirmed)   5.7-6.4%   Increased risk of developing Diabetes Mellitus   References:Diagnosis and Classification of Diabetes Mellitus,Diabetes ZOXW,9604,54(UJWJX 1):S62-S69 and Standards of Medical Care in         Diabetes - 2011,Diabetes BJYN,8295,62 (Suppl 1):S11-S61.     . Mean Plasma Glucose 07/01/2015 134* <117 mg/dL Final  . PSA 07/01/2015 2.57  <=4.00 ng/mL Final   Comment: Test Methodology: ECLIA PSA (Electrochemiluminescence Immunoassay)   For PSA values from 2.5-4.0, particularly in younger men <46 years old, the AUA and NCCN suggest testing for % Free PSA (3515) and evaluation of the rate of increase in PSA (PSA velocity).     Past Medical History  Diagnosis Date  . Colon polyps   . Kidney stones   . Pneumonia   . GERD (gastroesophageal reflux disease)   . Hyperlipidemia   . Prediabetes    Current Outpatient Prescriptions on File Prior to Visit  Medication Sig Dispense Refill  . aspirin 81 MG tablet Take 81 mg by mouth daily.      Marland Kitchen Fexofenadine HCl (ALLEGRA PO) Take by mouth 1 dose over 46  hours.      . fish oil-omega-3 fatty acids 1000 MG capsule Take 2 g by mouth daily.    Marland Kitchen gabapentin (NEURONTIN) 300 MG capsule TAKE 1 CAPSULE (300 MG TOTAL) BY MOUTH AT BEDTIME. 90 capsule 0  . pravastatin (PRAVACHOL) 20 MG tablet TAKE 2 TABLETS (40 MG TOTAL) BY MOUTH DAILY. 180 tablet 0  . Probiotic Product (PROBIOTIC DAILY PO) Take by mouth.    . vitamin B-12 (CYANOCOBALAMIN) 1000 MCG tablet Take 1,000 mcg by mouth daily.    Marland Kitchen zoster vaccine live, PF, (ZOSTAVAX) 13086 UNT/0.65ML injection Inject 19,400 Units into the skin once. 1 each 0   Current Facility-Administered Medications on File Prior to Visit  Medication Dose Route Frequency Provider Last Rate Last Dose  . 0.9 %  sodium chloride infusion  500 mL Intravenous Continuous Sable Feil, MD       Past Surgical History  Procedure Laterality Date  . Hernia repair  1979  . Neck surgery    . Knee surgery      RIGHT KNEE  . Lithotripsy  08 & 09  . Cervical spine surgery    . Arthroscopic repair acl     Allergies  Allergen Reactions  . Codeine Other (See Comments)    Nausea and passing out  . Penicillins  Social History   Social History  . Marital Status: Married    Spouse Name: N/A  . Number of Children: N/A  . Years of Education: N/A   Occupational History  . lineman    Social History Main Topics  . Smoking status: Never Smoker   . Smokeless tobacco: Not on file  . Alcohol Use: No  . Drug Use: No  . 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 Not on file   Social History Narrative     Review of Systems  All other systems reviewed and are negative.      Objective:   Physical Exam  Constitutional: He appears well-developed and well-nourished.  Neck: Neck supple. No JVD present. No thyromegaly present.  Cardiovascular: Normal rate, regular rhythm and normal heart sounds.   No murmur heard. Pulmonary/Chest: Effort normal and breath  sounds normal. No respiratory distress. He has no wheezes. He has no rales. He exhibits no tenderness.  Abdominal: Soft. Bowel sounds are normal. He exhibits no distension. There is no tenderness. There is no rebound and no guarding.  Musculoskeletal: He exhibits no edema.  Vitals reviewed.         Assessment & Plan:  HLD (hyperlipidemia)  Prediabetes  Patient's blood pressure today is elevated at 134/96. He states that it was also elevated on his recent DOT physical. Given his prediabetes, I will start him on losartan 50 mg by mouth daily and recheck it at his next office visit. Cholesterol is outstanding I'm very happy with that. Patient has lost 12 pounds and is changed his diet significantly. His hemoglobin A1c has fallen from 6.5-6.3. I'll recheck that in 6 months as well. PSA has increased slightly but at a rate more consistent with BPH. We will follow this up in 6 months. Patient is due for an EGD to monitor his Barrett's esophagus he will schedule this. I gave him a prescription for the shingles vaccine.

## 2015-07-15 ENCOUNTER — Telehealth: Payer: Self-pay | Admitting: Family Medicine

## 2015-07-15 MED ORDER — LOSARTAN POTASSIUM 50 MG PO TABS: 50.0000 mg | ORAL_TABLET | Freq: Every day | ORAL | Status: DC

## 2015-07-15 NOTE — Telephone Encounter (Addendum)
Pt's wife called and requests that the BP med that Dr. Dennard Schaumann called in for him be sent over to the [facility] on [address].  [phone]- wife Joseph Tucker

## 2015-07-15 NOTE — Telephone Encounter (Signed)
Medication called/sent to requested pharmacy  

## 2015-07-27 ENCOUNTER — Telehealth: Payer: Self-pay | Admitting: Family Medicine

## 2015-07-27 MED ORDER — PRAVASTATIN SODIUM 20 MG PO TABS: ORAL_TABLET | ORAL | Status: DC

## 2015-07-27 NOTE — Telephone Encounter (Signed)
Pt's wife is calling for a refill of Pravastatin because the pharmacy says that he is out of refills. [facility] [address] [phone]

## 2015-07-27 NOTE — Telephone Encounter (Signed)
Medication called/sent to requested pharmacy  

## 2015-10-08 ENCOUNTER — Other Ambulatory Visit: Payer: Self-pay | Admitting: Family Medicine

## 2015-10-08 NOTE — Telephone Encounter (Signed)
Refill appropriate and filled per protocol. 

## 2015-11-06 ENCOUNTER — Ambulatory Visit (INDEPENDENT_AMBULATORY_CARE_PROVIDER_SITE_OTHER): Payer: BLUE CROSS/BLUE SHIELD | Admitting: Family Medicine

## 2015-11-06 ENCOUNTER — Encounter: Payer: Self-pay | Admitting: Family Medicine

## 2015-11-06 VITALS — BP 138/82 | HR 88 | Temp 99.9°F | Resp 16 | Ht 70.0 in | Wt 184.0 lb

## 2015-11-06 DIAGNOSIS — J101 Influenza due to other identified influenza virus with other respiratory manifestations: Secondary | ICD-10-CM

## 2015-11-06 LAB — INFLUENZA A AND B AG, IMMUNOASSAY
Influenza A Antigen: DETECTED — AB
Influenza B Antigen: NOT DETECTED

## 2015-11-06 MED ORDER — OSELTAMIVIR PHOSPHATE 75 MG PO CAPS: 75.0000 mg | ORAL_CAPSULE | Freq: Two times a day (BID) | ORAL | Status: DC

## 2015-11-06 MED ORDER — BENZONATATE 100 MG PO CAPS: 100.0000 mg | ORAL_CAPSULE | Freq: Two times a day (BID) | ORAL | Status: DC | PRN
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
## 2015-11-06 NOTE — Patient Instructions (Signed)
Take Tamiflu  Take cough pills Rest/fluids F/U as needed

## 2015-11-12 ENCOUNTER — Ambulatory Visit: Payer: 59 | Admitting: Family Medicine

## 2015-11-24 ENCOUNTER — Other Ambulatory Visit: Payer: Self-pay | Admitting: *Deleted

## 2015-11-24 MED ORDER — PRAVASTATIN SODIUM 40 MG PO TABS: 40.0000 mg | ORAL_TABLET | Freq: Every day | ORAL | Status: DC

## 2016-01-08 ENCOUNTER — Ambulatory Visit: Payer: BLUE CROSS/BLUE SHIELD | Admitting: Family Medicine

## 2016-01-08 ENCOUNTER — Other Ambulatory Visit: Payer: BLUE CROSS/BLUE SHIELD

## 2016-01-08 VITALS — BP 146/94

## 2016-01-08 DIAGNOSIS — E785 Hyperlipidemia, unspecified: Secondary | ICD-10-CM

## 2016-01-08 DIAGNOSIS — R030 Elevated blood-pressure reading, without diagnosis of hypertension: Principal | ICD-10-CM

## 2016-01-08 DIAGNOSIS — IMO0001 Reserved for inherently not codable concepts without codable children: Secondary | ICD-10-CM

## 2016-01-08 DIAGNOSIS — R7303 Prediabetes: Secondary | ICD-10-CM

## 2016-01-08 LAB — CBC WITH DIFFERENTIAL/PLATELET
BASOS ABS: 44 {cells}/uL (ref 0–200)
Basophils Relative: 1 %
EOS PCT: 3 %
Eosinophils Absolute: 132 cells/uL (ref 15–500)
HEMATOCRIT: 41.2 % (ref 38.5–50.0)
Hemoglobin: 13.6 g/dL (ref 13.0–17.0)
LYMPHS ABS: 1320 {cells}/uL (ref 850–3900)
Lymphocytes Relative: 30 %
MCH: 28.6 pg (ref 27.0–33.0)
MCHC: 33 g/dL (ref 32.0–36.0)
MCV: 86.6 fL (ref 80.0–100.0)
MONOS PCT: 6 %
MPV: 9.3 fL (ref 7.5–12.5)
Monocytes Absolute: 264 cells/uL (ref 200–950)
NEUTROS ABS: 2640 {cells}/uL (ref 1500–7800)
NEUTROS PCT: 60 %
Platelets: 251 10*3/uL (ref 140–400)
RBC: 4.76 MIL/uL (ref 4.20–5.80)
RDW: 14.7 % (ref 11.0–15.0)
WBC: 4.4 10*3/uL (ref 3.8–10.8)

## 2016-01-08 LAB — COMPLETE METABOLIC PANEL WITH GFR
ALBUMIN: 4.3 g/dL (ref 3.6–5.1)
ALK PHOS: 35 U/L — AB (ref 40–115)
ALT: 12 U/L (ref 9–46)
AST: 13 U/L (ref 10–35)
BUN: 18 mg/dL (ref 7–25)
CALCIUM: 9.2 mg/dL (ref 8.6–10.3)
CO2: 26 mmol/L (ref 20–31)
CREATININE: 1.12 mg/dL (ref 0.70–1.25)
Chloride: 107 mmol/L (ref 98–110)
GFR, Est African American: 82 mL/min (ref >=60)
GFR, Est Non African American: 71 mL/min (ref >=60)
GLUCOSE: 108 mg/dL — AB (ref 70–99)
Potassium: 4.9 mmol/L (ref 3.5–5.3)
Sodium: 141 mmol/L (ref 135–146)
Total Bilirubin: 0.6 mg/dL (ref 0.2–1.2)
Total Protein: 6.7 g/dL (ref 6.1–8.1)

## 2016-01-08 LAB — HEMOGLOBIN A1C
HEMOGLOBIN A1C: 5.9 % — AB (ref <5.7)
MEAN PLASMA GLUCOSE: 123 mg/dL

## 2016-01-08 LAB — LIPID PANEL
CHOLESTEROL: 161 mg/dL (ref 125–200)
HDL: 54 mg/dL (ref >=40)
LDL Cholesterol: 95 mg/dL (ref <130)
Total CHOL/HDL Ratio: 3 Ratio (ref <=5.0)
Triglycerides: 62 mg/dL (ref <150)
VLDL: 12 mg/dL (ref <30)

## 2016-01-12 ENCOUNTER — Ambulatory Visit: Payer: 59 | Admitting: Family Medicine

## 2016-01-14 ENCOUNTER — Encounter: Payer: Self-pay | Admitting: Family Medicine

## 2016-01-14 ENCOUNTER — Ambulatory Visit (INDEPENDENT_AMBULATORY_CARE_PROVIDER_SITE_OTHER): Payer: BLUE CROSS/BLUE SHIELD | Admitting: Family Medicine

## 2016-01-14 VITALS — BP 142/98 | HR 61 | Temp 98.7°F | Resp 14 | Ht 70.0 in | Wt 183.0 lb

## 2016-01-14 DIAGNOSIS — E785 Hyperlipidemia, unspecified: Secondary | ICD-10-CM | POA: Diagnosis not present

## 2016-01-14 DIAGNOSIS — N400 Enlarged prostate without lower urinary tract symptoms: Secondary | ICD-10-CM

## 2016-01-14 DIAGNOSIS — R7303 Prediabetes: Secondary | ICD-10-CM | POA: Diagnosis not present

## 2016-01-14 DIAGNOSIS — IMO0001 Reserved for inherently not codable concepts without codable children: Secondary | ICD-10-CM

## 2016-01-14 DIAGNOSIS — R030 Elevated blood-pressure reading, without diagnosis of hypertension: Secondary | ICD-10-CM | POA: Diagnosis not present

## 2016-01-14 MED ORDER — LOSARTAN POTASSIUM-HCTZ 100-12.5 MG PO TABS: 1.0000 | ORAL_TABLET | Freq: Every day | ORAL | Status: DC

## 2016-01-14 NOTE — Progress Notes (Signed)
Subjective:    Patient ID: Joseph Tucker, male    DOB: Oct 16, 1955, 60 y.o.   MRN: [id number]  HPI  Patient is here for follow up for his chronic medical conditions including hyperlipidemia, prediabetes, and hypertension.  Most recent lab work is listed below: Lab on 01/08/2016  Component Date Value Ref Range Status  . Sodium 01/08/2016 141  135 - 146 mmol/L Final  . Potassium 01/08/2016 4.9  3.5 - 5.3 mmol/L Final  . Chloride 01/08/2016 107  98 - 110 mmol/L Final  . CO2 01/08/2016 26  20 - 31 mmol/L Final  . Glucose, Bld 01/08/2016 108* 70 - 99 mg/dL Final  . BUN 01/08/2016 18  7 - 25 mg/dL Final  . Creat 01/08/2016 1.12  0.70 - 1.25 mg/dL Final  . Total Bilirubin 01/08/2016 0.6  0.2 - 1.2 mg/dL Final  . Alkaline Phosphatase 01/08/2016 35* 40 - 115 U/L Final  . AST 01/08/2016 13  10 - 35 U/L Final  . ALT 01/08/2016 12  9 - 46 U/L Final  . Total Protein 01/08/2016 6.7  6.1 - 8.1 g/dL Final  . Albumin 01/08/2016 4.3  3.6 - 5.1 g/dL Final  . Calcium 01/08/2016 9.2  8.6 - 10.3 mg/dL Final  . GFR, Est African American 01/08/2016 82  >=60 mL/min Final  . GFR, Est Non African American 01/08/2016 71  >=60 mL/min Final   Comment:   The estimated GFR is a calculation valid for adults (>=84 years old) that uses the CKD-EPI algorithm to adjust for age and sex. It is   not to be used for children, pregnant women, hospitalized patients,    patients on dialysis, or with rapidly changing kidney function. According to the NKDEP, eGFR >89 is normal, 60-89 shows mild impairment, 30-59 shows moderate impairment, 15-29 shows severe impairment and <15 is ESRD.     Marland Kitchen Cholesterol 01/08/2016 161  125 - 200 mg/dL Final  . Triglycerides 01/08/2016 62  <150 mg/dL Final  . HDL 01/08/2016 54  >=40 mg/dL Final  . Total CHOL/HDL Ratio 01/08/2016 3.0  <=5.0 Ratio Final  . VLDL 01/08/2016 12  <30 mg/dL Final  . LDL Cholesterol 01/08/2016 95  <130 mg/dL Final   Comment:   Total Cholesterol/HDL Ratio:CHD  Risk                        Coronary Heart Disease Risk Table                                        Men       Women          1/2 Average Risk              3.4        3.3              Average Risk              5.0        4.4           2X Average Risk              9.6        7.1           3X Average Risk             23.4  11.0 Use the calculated Patient Ratio above and the CHD Risk table  to determine the patient's CHD Risk.   . Hgb A1c MFr Bld 01/08/2016 5.9* <5.7 % Final   Comment:   For someone without known diabetes, a hemoglobin A1c value between 5.7% and 6.4% is consistent with prediabetes and should be confirmed with a follow-up test.   For someone with known diabetes, a value <7% indicates that their diabetes is well controlled. A1c targets should be individualized based on duration of diabetes, age, co-morbid conditions and other considerations.   This assay result is consistent with an increased risk of diabetes.   Currently, no consensus exists regarding use of hemoglobin A1c for diagnosis of diabetes in children.     . Mean Plasma Glucose 01/08/2016 123   Final  . WBC 01/08/2016 4.4  3.8 - 10.8 K/uL Final  . RBC 01/08/2016 4.76  4.20 - 5.80 MIL/uL Final  . Hemoglobin 01/08/2016 13.6  13.0 - 17.0 g/dL Final  . HCT 01/08/2016 41.2  38.5 - 50.0 % Final  . MCV 01/08/2016 86.6  80.0 - 100.0 fL Final  . MCH 01/08/2016 28.6  27.0 - 33.0 pg Final  . MCHC 01/08/2016 33.0  32.0 - 36.0 g/dL Final  . RDW 01/08/2016 14.7  11.0 - 15.0 % Final  . Platelets 01/08/2016 251  140 - 400 K/uL Final  . MPV 01/08/2016 9.3  7.5 - 12.5 fL Final  . Neutro Abs 01/08/2016 2640  1500 - 7800 cells/uL Final  . Lymphs Abs 01/08/2016 1320  850 - 3900 cells/uL Final  . Monocytes Absolute 01/08/2016 264  200 - 950 cells/uL Final  . Eosinophils Absolute 01/08/2016 132  15 - 500 cells/uL Final  . Basophils Absolute 01/08/2016 44  0 - 200 cells/uL Final  . Neutrophils Relative % 01/08/2016 60    Final  . Lymphocytes Relative 01/08/2016 30   Final  . Monocytes Relative 01/08/2016 6   Final  . Eosinophils Relative 01/08/2016 3   Final  . Basophils Relative 01/08/2016 1   Final  . Smear Review 01/08/2016 Criteria for review not met   Final   ** Please note change in unit of measure and reference range(s). **   He continues to lose weight intentionally. He continues to watch his carbohydrates. His A1c is down to 5.9. His cholesterol is excellent. Unfortunately his blood pressure remains elevated. He is consistent with taking his losartan. I last checked his PSA 6 months ago. I have been checking him every 6 months due to a rapid rise in his PSA from 1-2.2. The last time I checked it it was 2.57. I would like to recheck that today to make sure that the rapid increase has stalled. Otherwise he is doing well with no concerns  Past Medical History  Diagnosis Date  . Colon polyps   . Kidney stones   . Pneumonia   . GERD (gastroesophageal reflux disease)   . Hyperlipidemia   . Prediabetes   . Hypertension    Current Outpatient Prescriptions on File Prior to Visit  Medication Sig Dispense Refill  . aspirin 81 MG tablet Take 81 mg by mouth daily.      . benzonatate (TESSALON) 100 MG capsule Take 1 capsule (100 mg total) by mouth 2 (two) times daily as needed for cough. 20 capsule 0  . Fexofenadine HCl (ALLEGRA PO) Take by mouth 1 dose over 46 hours.      . fish oil-omega-3 fatty acids 1000 MG  capsule Take 2 g by mouth daily.    Marland Kitchen gabapentin (NEURONTIN) 300 MG capsule TAKE 1 CAPSULE(S) BY MOUTH AT BEDTIME 30 capsule 3  . losartan (COZAAR) 50 MG tablet Take 1 tablet (50 mg total) by mouth daily. 90 tablet 3  . oseltamivir (TAMIFLU) 75 MG capsule Take 1 capsule (75 mg total) by mouth 2 (two) times daily. 10 capsule 0  . pravastatin (PRAVACHOL) 40 MG tablet Take 1 tablet (40 mg total) by mouth daily. . 90 tablet 3  . Probiotic Product (PROBIOTIC DAILY PO) Take by mouth.    . vitamin B-12  (CYANOCOBALAMIN) 1000 MCG tablet Take 1,000 mcg by mouth daily.     Current Facility-Administered Medications on File Prior to Visit  Medication Dose Route Frequency Provider Last Rate Last Dose  . 0.9 %  sodium chloride infusion  500 mL Intravenous Continuous Sable Feil, MD       Past Surgical History  Procedure Laterality Date  . Hernia repair  1979  . Neck surgery    . Knee surgery      RIGHT KNEE  . Lithotripsy  08 & 09  . Cervical spine surgery    . Arthroscopic repair acl     Allergies  Allergen Reactions  . Codeine Other (See Comments)    Nausea and passing out  . Penicillins    Social History   Social History  . Marital Status: Married    Spouse Name: N/A  . Number of Children: N/A  . Years of Education: N/A   Occupational History  . lineman    Social History Main Topics  . Smoking status: Never Smoker   . Smokeless tobacco: Not on file  . Alcohol Use: No  . Drug Use: No  . 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 Not on file   Social History Narrative     Review of Systems  All other systems reviewed and are negative.      Objective:   Physical Exam  Constitutional: He appears well-developed and well-nourished.  Neck: Neck supple. No JVD present. No thyromegaly present.  Cardiovascular: Normal rate, regular rhythm and normal heart sounds.   No murmur heard. Pulmonary/Chest: Effort normal and breath sounds normal. No respiratory distress. He has no wheezes. He has no rales. He exhibits no tenderness.  Abdominal: Soft. Bowel sounds are normal. He exhibits no distension. There is no tenderness. There is no rebound and no guarding.  Musculoskeletal: He exhibits no edema.  Vitals reviewed.         Assessment & Plan:  Elevated BP - Plan: losartan-hydrochlorothiazide (HYZAAR) 100-12.5 MG tablet  HLD (hyperlipidemia)  Prediabetes  BPH (benign prostatic hyperplasia) -  Plan: PSA  We will add a PSA.  If his PSA continues to remain steady, I will decrease screening frequency to every 12 months. If PSA rapidly increases again, I would recommend a urology consultation. Discontinue losartan and switch the patient to Hyzaar 100/12.5 one by mouth daily and recheck blood pressure in one month. Blood sugars remain stable and much better. Cholesterol is excellent.

## 2016-01-15 LAB — PSA: PSA: 2.11 ng/mL (ref <=4.00)

## 2016-06-01 DIAGNOSIS — M7032 Other bursitis of elbow, left elbow: Secondary | ICD-10-CM | POA: Diagnosis not present

## 2016-06-03 ENCOUNTER — Other Ambulatory Visit: Payer: Self-pay | Admitting: Family Medicine

## 2016-08-03 ENCOUNTER — Other Ambulatory Visit: Payer: Self-pay | Admitting: Family Medicine

## 2016-08-09 ENCOUNTER — Telehealth: Payer: Self-pay | Admitting: *Deleted

## 2016-08-09 MED ORDER — PRAVASTATIN SODIUM 40 MG PO TABS: 40.0000 mg | ORAL_TABLET | Freq: Every day | ORAL | 3 refills | Status: DC

## 2016-08-09 NOTE — Telephone Encounter (Signed)
Received request from pharmacy for PA on Pravastatin 20mg  (2) tabs PO Q HS.    PA denied. New prescription sent to pharmacy for Pravastain 40mg  PO QD.

## 2016-08-19 ENCOUNTER — Telehealth: Payer: Self-pay | Admitting: Family Medicine

## 2016-08-19 MED ORDER — PRAVASTATIN SODIUM 20 MG PO TABS: 20.0000 mg | ORAL_TABLET | Freq: Every day | ORAL | 3 refills | Status: DC

## 2016-08-19 NOTE — Telephone Encounter (Signed)
Pts wife aware it should be 20mg  qd. She did just get it filled for 40mg  and opened package so he will take 40mg  qod until next rx is due then she will call for the 20mg  tabs.

## 2016-08-19 NOTE — Telephone Encounter (Signed)
Pt's wife called and states that his pravastatin was changed from 20mg  to 40mg  and she thinks it should be 20mg  to please give her a call back.   What happened is that it was in his chart 20mg  bid and his ins would not cover that way so it was inadvertently sent in for 40mg  qd. Pt should only be on 20mg  once a day per previous notes.   Called pt and [facility]

## 2016-10-19 ENCOUNTER — Telehealth: Payer: Self-pay | Admitting: Family Medicine

## 2016-10-19 NOTE — Telephone Encounter (Signed)
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

## 2016-10-21 ENCOUNTER — Encounter: Payer: Self-pay | Admitting: Family Medicine

## 2016-10-21 NOTE — Telephone Encounter (Signed)
I would be glad to.    Is there a form they want me to fill out?  If not, what does he specifically need me to say in the letter.  Usually, there is a form I have to fil out and I don't have one on my desk for him.

## 2016-10-21 NOTE — Telephone Encounter (Signed)
Patient's wife aware to p/u letter

## 2016-10-21 NOTE — Telephone Encounter (Signed)
When you call her back sandy she said please call [phone]

## 2016-10-22 ENCOUNTER — Other Ambulatory Visit: Payer: Self-pay | Admitting: Family Medicine

## 2016-11-18 ENCOUNTER — Ambulatory Visit (INDEPENDENT_AMBULATORY_CARE_PROVIDER_SITE_OTHER): Payer: BLUE CROSS/BLUE SHIELD | Admitting: Family Medicine

## 2016-11-18 ENCOUNTER — Encounter: Payer: Self-pay | Admitting: Family Medicine

## 2016-11-18 VITALS — BP 110/68 | HR 78 | Temp 98.5°F | Resp 18 | Ht 70.0 in | Wt 196.0 lb

## 2016-11-18 DIAGNOSIS — R300 Dysuria: Secondary | ICD-10-CM

## 2016-11-18 LAB — URINALYSIS, ROUTINE W REFLEX MICROSCOPIC
BILIRUBIN URINE: NEGATIVE
GLUCOSE, UA: NEGATIVE
KETONES UR: NEGATIVE
Nitrite: POSITIVE — AB
SPECIFIC GRAVITY, URINE: 1.02 (ref 1.001–1.035)
pH: 7 (ref 5.0–8.0)

## 2016-11-18 LAB — URINALYSIS, MICROSCOPIC ONLY
CASTS: NONE SEEN [LPF]
CRYSTALS: NONE SEEN [HPF]
WBC, UA: >60 WBC/HPF — AB (ref <=5)
Yeast: NONE SEEN [HPF]

## 2016-11-18 MED ORDER — CIPROFLOXACIN HCL 500 MG PO TABS
500.0000 mg | ORAL_TABLET | Freq: Two times a day (BID) | ORAL | 0 refills | Status: DC
Start: 2016-11-18 — End: 2017-01-26

## 2016-11-18 NOTE — Progress Notes (Signed)
Subjective:    Patient ID: Joseph Tucker, male    DOB: 05-31-1956, 61 y.o.   MRN: [id number]  HPI Patient is here for Dysuria. For the last week he reports dysuria with urination. He also reports fevers and chills and fatigue.Marland Kitchen Urinalysis is shown below: Office Visit on 11/18/2016  Component Date Value Ref Range Status  . Color, Urine 11/18/2016 YELLOW  YELLOW Final  . APPearance 11/18/2016 CLOUDY* CLEAR Final  . Specific Gravity, Urine 11/18/2016 1.020  1.001 - 1.035 Final  . pH 11/18/2016 7.0  5.0 - 8.0 Final  . Glucose, UA 11/18/2016 NEGATIVE  NEGATIVE Final  . Bilirubin Urine 11/18/2016 NEGATIVE  NEGATIVE Final  . Ketones, ur 11/18/2016 NEGATIVE  NEGATIVE Final  . Hgb urine dipstick 11/18/2016 3+* NEGATIVE Final  . Protein, ur 11/18/2016 2+* NEGATIVE Final  . Nitrite 11/18/2016 POSITIVE* NEGATIVE Final  . Leukocytes, UA 11/18/2016 3+* NEGATIVE Final  . WBC, UA 11/18/2016 >60* <=5 WBC/HPF Final  . RBC / HPF 11/18/2016 20-40* <=2 RBC/HPF Final  . Squamous Epithelial / LPF 11/18/2016 0-5  <=5 HPF Final  . Bacteria, UA 11/18/2016 MANY* NONE SEEN HPF Final  . Crystals 11/18/2016 NONE SEEN  NONE SEEN HPF Final  . Casts 11/18/2016 NONE SEEN  NONE SEEN LPF Final  . Yeast 11/18/2016 NONE SEEN  NONE SEEN HPF Final   Patient has a history of prostatitis Past Medical History:  Diagnosis Date  . Colon polyps   . GERD (gastroesophageal reflux disease)   . Hyperlipidemia   . Hypertension   . Kidney stones   . Pneumonia   . Prediabetes    Current Outpatient Prescriptions on File Prior to Visit  Medication Sig Dispense Refill  . aspirin 81 MG tablet Take 81 mg by mouth daily.      Marland Kitchen Fexofenadine HCl (ALLEGRA PO) Take by mouth 1 dose over 46 hours.      . fish oil-omega-3 fatty acids 1000 MG capsule Take 2 g by mouth daily.    Marland Kitchen gabapentin (NEURONTIN) 300 MG capsule TAKE 1 CAPSULE(S) BY MOUTH AT BEDTIME 30 capsule 5  . losartan-hydrochlorothiazide (HYZAAR) 100-12.5 MG tablet Take  1 tablet by mouth daily. 90 tablet 3  . pravastatin (PRAVACHOL) 20 MG tablet Take 1 tablet (20 mg total) by mouth daily. 90 tablet 3  . Probiotic Product (PROBIOTIC DAILY PO) Take by mouth.    . vitamin B-12 (CYANOCOBALAMIN) 1000 MCG tablet Take 1,000 mcg by mouth daily.     No current facility-administered medications on file prior to visit.    Past Surgical History:  Procedure Laterality Date  . ARTHROSCOPIC REPAIR ACL    . CERVICAL SPINE SURGERY    . HERNIA REPAIR  1979  . KNEE SURGERY     RIGHT KNEE  . LITHOTRIPSY  08 & 09  . NECK SURGERY     Allergies  Allergen Reactions  . Codeine Other (See Comments)    Nausea and passing out  . Penicillins    Social History   Social History  . Marital status: Married    Spouse name: N/A  . Number of children: N/A  . Years of education: N/A   Occupational History  . lineman    Social History Main Topics  . Smoking status: Never Smoker  . Smokeless tobacco: Never Used  . Alcohol use No  . Drug use: No  . Sexual activity: Yes     Comment: married, owns an Hydrologist   Other Topics Concern  .  Not on file   Social History Narrative  . No narrative on file     Review of Systems  All other systems reviewed and are negative.      Objective:   Physical Exam  Constitutional: He appears well-developed and well-nourished.  Neck: Neck supple. No JVD present. No thyromegaly present.  Cardiovascular: Normal rate, regular rhythm and normal heart sounds.   No murmur heard. Pulmonary/Chest: Effort normal and breath sounds normal. No respiratory distress. He has no wheezes. He has no rales. He exhibits no tenderness.  Abdominal: Soft. Bowel sounds are normal. He exhibits no distension. There is no tenderness. There is no rebound and no guarding.  Musculoskeletal: He exhibits no edema.  Vitals reviewed.         Assessment & Plan:  Burning with urination - Plan: Urinalysis, Routine w reflex microscopic,  ciprofloxacin (CIPRO) 500 MG tablet  Begin Cipro 500 mg by mouth twice a day for 10 days. Recheck next week if no better

## 2016-12-19 ENCOUNTER — Other Ambulatory Visit: Payer: BLUE CROSS/BLUE SHIELD

## 2016-12-19 DIAGNOSIS — R319 Hematuria, unspecified: Secondary | ICD-10-CM

## 2016-12-19 DIAGNOSIS — R300 Dysuria: Secondary | ICD-10-CM | POA: Diagnosis not present

## 2016-12-19 LAB — URINALYSIS, MICROSCOPIC ONLY
CASTS: NONE SEEN [LPF]
CRYSTALS: NONE SEEN [HPF]
SQUAMOUS EPITHELIAL / LPF: NONE SEEN [HPF] (ref <=5)
Yeast: NONE SEEN [HPF]

## 2016-12-19 LAB — URINALYSIS, ROUTINE W REFLEX MICROSCOPIC
Bilirubin Urine: NEGATIVE
Glucose, UA: NEGATIVE
KETONES UR: NEGATIVE
NITRITE: NEGATIVE
Protein, ur: NEGATIVE
SPECIFIC GRAVITY, URINE: 1.025 (ref 1.001–1.035)
pH: 5.5 (ref 5.0–8.0)

## 2016-12-19 MED ORDER — SULFAMETHOXAZOLE-TRIMETHOPRIM 800-160 MG PO TABS: 1.0000 | ORAL_TABLET | Freq: Two times a day (BID) | ORAL | 0 refills | Status: DC

## 2016-12-19 NOTE — Progress Notes (Signed)
Pt's urine shows blood and pus and per [facility] needs bactrim DS bid x 14 days, urine culture and if it happens again will need to consult urology. Pt aware and states that he has seen Dr. Jeffie Pollock in the past and would like to see him again if needed. Med sent to requested pharm.

## 2016-12-22 LAB — URINE CULTURE

## 2017-01-19 ENCOUNTER — Other Ambulatory Visit: Payer: Self-pay | Admitting: Family Medicine

## 2017-01-26 ENCOUNTER — Encounter: Payer: Self-pay | Admitting: Family Medicine

## 2017-01-26 ENCOUNTER — Ambulatory Visit (INDEPENDENT_AMBULATORY_CARE_PROVIDER_SITE_OTHER): Payer: BLUE CROSS/BLUE SHIELD | Admitting: Family Medicine

## 2017-01-26 VITALS — BP 126/80 | HR 78 | Temp 98.4°F | Resp 14 | Ht 70.0 in | Wt 194.0 lb

## 2017-01-26 DIAGNOSIS — R300 Dysuria: Secondary | ICD-10-CM

## 2017-01-26 DIAGNOSIS — E119 Type 2 diabetes mellitus without complications: Secondary | ICD-10-CM

## 2017-01-26 DIAGNOSIS — N390 Urinary tract infection, site not specified: Secondary | ICD-10-CM | POA: Diagnosis not present

## 2017-01-26 DIAGNOSIS — Z0000 Encounter for general adult medical examination without abnormal findings: Secondary | ICD-10-CM | POA: Diagnosis not present

## 2017-01-26 LAB — URINALYSIS, ROUTINE W REFLEX MICROSCOPIC
BILIRUBIN URINE: NEGATIVE
GLUCOSE, UA: NEGATIVE
KETONES UR: NEGATIVE
Nitrite: POSITIVE — AB
Specific Gravity, Urine: 1.03 (ref 1.001–1.035)
pH: 6 (ref 5.0–8.0)

## 2017-01-26 LAB — URINALYSIS, MICROSCOPIC ONLY
Casts: NONE SEEN [LPF]
Crystals: NONE SEEN [HPF]
SQUAMOUS EPITHELIAL / LPF: NONE SEEN [HPF] (ref <=5)
WBC, UA: >60 WBC/HPF — AB (ref <=5)
YEAST: NONE SEEN [HPF]

## 2017-01-26 MED ORDER — CIPROFLOXACIN HCL 500 MG PO TABS: 500.0000 mg | ORAL_TABLET | Freq: Two times a day (BID) | ORAL | 0 refills | Status: DC

## 2017-01-26 MED ORDER — CIPROFLOXACIN HCL 250 MG PO TABS: 250.0000 mg | ORAL_TABLET | Freq: Two times a day (BID) | ORAL | 0 refills | Status: DC

## 2017-01-26 NOTE — Progress Notes (Signed)
Subjective:    Patient ID: Joseph Tucker, male    DOB: 1956-04-24, 61 y.o.   MRN: [id number]  HPI Patient was originally seen in April for urinary tract infection. Urine culture at that time revealed staph aureus that was pansensitive. He was treated with 2 weeks of Cipro and symptoms completely resolved. However 2 weeks after discontinuing Cipro, symptoms have returned. He has been suffering now off and on for several weeks for any increased urinary frequency, urgency, dysuria, hesitancy. He has taken antibiotics on 2 separate occasions for urinary tract infections in the last 6 months. This would now be the third time he is required antibiotics. Urinalysis today reveals hematuria, pyuria, nitrites, leukocyte esterase. No visits with results within 1 Day(s) from this visit.  Latest known visit with results is:  Lab on 12/19/2016  Component Date Value Ref Range Status  . Color, Urine 12/19/2016 YELLOW  YELLOW Final  . APPearance 12/19/2016 CLEAR  CLEAR Final  . Specific Gravity, Urine 12/19/2016 1.025  1.001 - 1.035 Final  . pH 12/19/2016 5.5  5.0 - 8.0 Final  . Glucose, UA 12/19/2016 NEGATIVE  NEGATIVE Final  . Bilirubin Urine 12/19/2016 NEGATIVE  NEGATIVE Final  . Ketones, ur 12/19/2016 NEGATIVE  NEGATIVE Final  . Hgb urine dipstick 12/19/2016 1+* NEGATIVE Final  . Protein, ur 12/19/2016 NEGATIVE  NEGATIVE Final  . Nitrite 12/19/2016 NEGATIVE  NEGATIVE Final  . Leukocytes, UA 12/19/2016 1+* NEGATIVE Final  . WBC, UA 12/19/2016 6-10* <=5 WBC/HPF Final  . RBC / HPF 12/19/2016 3-10* <=2 RBC/HPF Final  . Squamous Epithelial / LPF 12/19/2016 NONE SEEN  <=5 HPF Final  . Bacteria, UA 12/19/2016 FEW* NONE SEEN HPF Final  . Crystals 12/19/2016 NONE SEEN  NONE SEEN HPF Final  . Casts 12/19/2016 NONE SEEN  NONE SEEN LPF Final  . Yeast 12/19/2016 NONE SEEN  NONE SEEN HPF Final  . Culture 12/19/2016 STAPHYLOCOCCUS AUREUS   Final  . Organism ID, Bacteria 12/19/2016 STAPHYLOCOCCUS AUREUS    Final   Comment: SUSCEPTIBILITY TEST REPORT TO FOLLOW Rifampin and Gentamicin should not be used as single drugs for treatment of Staph infections.    Patient has a history of prostatitis Past Medical History:  Diagnosis Date  . Colon polyps   . GERD (gastroesophageal reflux disease)   . Hyperlipidemia   . Hypertension   . Kidney stones   . Pneumonia   . Prediabetes    Current Outpatient Prescriptions on File Prior to Visit  Medication Sig Dispense Refill  . aspirin 81 MG tablet Take 81 mg by mouth daily.      Marland Kitchen Fexofenadine HCl (ALLEGRA PO) Take by mouth 1 dose over 46 hours.      . fish oil-omega-3 fatty acids 1000 MG capsule Take 2 g by mouth daily.    Marland Kitchen gabapentin (NEURONTIN) 300 MG capsule TAKE 1 CAPSULE(S) BY MOUTH AT BEDTIME 30 capsule 5  . losartan-hydrochlorothiazide (HYZAAR) 100-12.5 MG tablet TAKE ONE TABLET BY MOUTH DAILY 90 tablet 2  . pravastatin (PRAVACHOL) 20 MG tablet Take 1 tablet (20 mg total) by mouth daily. 90 tablet 3  . Probiotic Product (PROBIOTIC DAILY PO) Take by mouth.    . vitamin B-12 (CYANOCOBALAMIN) 1000 MCG tablet Take 1,000 mcg by mouth daily.     No current facility-administered medications on file prior to visit.    Past Surgical History:  Procedure Laterality Date  . ARTHROSCOPIC REPAIR ACL    . CERVICAL SPINE SURGERY    . HERNIA  REPAIR  1979  . KNEE SURGERY     RIGHT KNEE  . LITHOTRIPSY  08 & 09  . NECK SURGERY     Allergies  Allergen Reactions  . Codeine Other (See Comments)    Nausea and passing out  . Penicillins    Social History   Social History  . Marital status: Married    Spouse name: N/A  . Number of children: N/A  . Years of education: N/A   Occupational History  . lineman    Social History Main Topics  . Smoking status: Never Smoker  . Smokeless tobacco: Never Used  . Alcohol use No  . Drug use: No  . Sexual activity: Yes     Comment: married, owns an Hy[employer]   Other Topics Concern   . Not on file   Social History Narrative  . No narrative on file     Review of Systems  All other systems reviewed and are negative.      Objective:   Physical Exam  Constitutional: He appears well-developed and well-nourished.  Neck: Neck supple. No JVD present. No thyromegaly present.  Cardiovascular: Normal rate, regular rhythm and normal heart sounds.   No murmur heard. Pulmonary/Chest: Effort normal and breath sounds normal. No respiratory distress. He has no wheezes. He has no rales. He exhibits no tenderness.  Abdominal: Soft. Bowel sounds are normal. He exhibits no distension. There is no tenderness. There is no rebound and no guarding.  Musculoskeletal: He exhibits no edema.  Vitals reviewed.         Assessment & Plan:  Burning with urination - Plan: Urinalysis, Routine w reflex microscopic  Controlled type 2 diabetes mellitus without complication, without long-term current use of insulin (HCC) - Plan: COMPLETE METABOLIC PANEL WITH GFR, Lipid panel, Hemoglobin A1c  General medical exam - Plan: COMPLETE METABOLIC PANEL WITH GFR, Lipid panel, PSA, Hemoglobin A1c  Recurrent UTI - Plan: Ambulatory referral to Urology  I suspect that the patient may have chronic prostatitis which is seeding his bladder causing his recurrent urinary tract infections. I will treat the patient with 4 weeks of Cipro 250 mg by mouth twice a day to treat chronic prostatitis. I will send a urine culture to determine the species and sensitivity of the bacteria. I recommended the patient increase the amount of water that he is drinking and began to empty his bladder more frequently throughout the day to prevent this in the future. Given the recurrent nature mouth 3 times in 6 months, I will also consult urology for a renal ultrasound as well as a possible cystoscopy to rule out anatomic lesions that can cause recurrent urinary tract infections.

## 2017-01-27 LAB — COMPLETE METABOLIC PANEL WITH GFR
ALT: 26 U/L (ref 9–46)
AST: 17 U/L (ref 10–35)
Albumin: 4.4 g/dL (ref 3.6–5.1)
Alkaline Phosphatase: 36 U/L — ABNORMAL LOW (ref 40–115)
BILIRUBIN TOTAL: 0.7 mg/dL (ref 0.2–1.2)
BUN: 23 mg/dL (ref 7–25)
CO2: 25 mmol/L (ref 20–31)
Calcium: 9.4 mg/dL (ref 8.6–10.3)
Chloride: 105 mmol/L (ref 98–110)
Creat: 1.44 mg/dL — ABNORMAL HIGH (ref 0.70–1.25)
GFR, EST AFRICAN AMERICAN: 60 mL/min (ref >=60)
GFR, Est Non African American: 52 mL/min — ABNORMAL LOW (ref >=60)
GLUCOSE: 105 mg/dL — AB (ref 70–99)
POTASSIUM: 4.1 mmol/L (ref 3.5–5.3)
SODIUM: 141 mmol/L (ref 135–146)
Total Protein: 6.9 g/dL (ref 6.1–8.1)

## 2017-01-27 LAB — LIPID PANEL
CHOL/HDL RATIO: 3.3 ratio (ref <5.0)
Cholesterol: 164 mg/dL (ref <200)
HDL: 49 mg/dL (ref >40)
LDL CALC: 97 mg/dL (ref <100)
TRIGLYCERIDES: 89 mg/dL (ref <150)
VLDL: 18 mg/dL (ref <30)

## 2017-01-27 LAB — HEMOGLOBIN A1C
Hgb A1c MFr Bld: 5.9 % — ABNORMAL HIGH (ref <5.7)
Mean Plasma Glucose: 123 mg/dL

## 2017-01-27 LAB — PSA: PSA: 3.5 ng/mL (ref <=4.0)

## 2017-01-29 LAB — URINE CULTURE

## 2017-02-03 ENCOUNTER — Encounter: Payer: BLUE CROSS/BLUE SHIELD | Admitting: Family Medicine

## 2017-02-09 ENCOUNTER — Ambulatory Visit (INDEPENDENT_AMBULATORY_CARE_PROVIDER_SITE_OTHER): Payer: BLUE CROSS/BLUE SHIELD | Admitting: Family Medicine

## 2017-02-09 ENCOUNTER — Encounter: Payer: Self-pay | Admitting: Family Medicine

## 2017-02-09 VITALS — BP 120/80 | HR 60 | Temp 98.1°F | Resp 14 | Ht 70.0 in | Wt 194.0 lb

## 2017-02-09 DIAGNOSIS — N400 Enlarged prostate without lower urinary tract symptoms: Secondary | ICD-10-CM

## 2017-02-09 DIAGNOSIS — Z0000 Encounter for general adult medical examination without abnormal findings: Secondary | ICD-10-CM | POA: Diagnosis not present

## 2017-02-09 DIAGNOSIS — E7800 Pure hypercholesterolemia, unspecified: Secondary | ICD-10-CM

## 2017-02-09 DIAGNOSIS — N390 Urinary tract infection, site not specified: Secondary | ICD-10-CM

## 2017-02-09 DIAGNOSIS — R7303 Prediabetes: Secondary | ICD-10-CM | POA: Diagnosis not present

## 2017-02-09 MED ORDER — PRAVASTATIN SODIUM 20 MG PO TABS: 20.0000 mg | ORAL_TABLET | Freq: Every day | ORAL | 3 refills | Status: DC

## 2017-02-09 NOTE — Progress Notes (Signed)
Subjective:    Patient ID: Joseph Tucker, male    DOB: August 22, 1955, 61 y.o.   MRN: [id number]  HPI  01/26/17 Patient was originally seen in April for urinary tract infection. Urine culture at that time revealed staph aureus that was pansensitive. He was treated with 2 weeks of Cipro and symptoms completely resolved. However 2 weeks after discontinuing Cipro, symptoms have returned. He has been suffering now off and on for several weeks for any increased urinary frequency, urgency, dysuria, hesitancy. He has taken antibiotics on 2 separate occasions for urinary tract infections in the last 6 months. This would now be the third time he is required antibiotics. Urinalysis today reveals hematuria, pyuria, nitrites, leukocyte esterase.  At that time, my plan was: I suspect that the patient may have chronic prostatitis which is seeding his bladder causing his recurrent urinary tract infections. I will treat the patient with 4 weeks of Cipro 250 mg by mouth twice a day to treat chronic prostatitis. I will send a urine culture to determine the species and sensitivity of the bacteria. I recommended the patient increase the amount of water that he is drinking and began to empty his bladder more frequently throughout the day to prevent this in the future. Given the recurrent nature mouth 3 times in 6 months, I will also consult urology for a renal ultrasound as well as a possible cystoscopy to rule out anatomic lesions that can cause recurrent urinary tract infections.  02/09/17 Patient is here today for complete physical exam. He is still not heard from the urologist office about an appointment. Information was faxed to them on the 21st but there has been no contact made with the patient. I have my office call this morning and leave a message with the referrals person who is not yet in office to touch base with him to follow-through make sure that the appointment is made. Fortunately his symptoms have improved on  just 2 weeks of Cipro. He denies any dysuria or hematuria or frequency or urgency. However he is still not drinking enough water. His most recent creatinine shows an increase from 1.12-1.44 indicating a decline in his renal function. His PSA has also increased. He tends to run in the low to mid 2 range.  At his last visit while he was being treated for urinary tract infection/chronic prostatitis, his PSA was up to 3.5.  His last colonoscopy was performed in 2012. Review of his overdue for health maintenance states that he is due for Pneumovax 23, a diabetic eye exam, a diabetic foot exam, a hemoglobin A1c, HIV screening. He declines HIV screening. The patient is not a diabetic. Therefore the other indications are not accurate. A hemoglobin A1c was drawn because his blood sugar was slightly elevated. He is borderline prediabetic. Therefore he does not require foot exam, ophthalmology exam, Pneumovax 23. This is an error. No visits with results within 1 Day(s) from this visit.  Latest known visit with results is:  Office Visit on 01/26/2017  Component Date Value Ref Range Status  . Color, Urine 01/26/2017 YELLOW  YELLOW Final  . APPearance 01/26/2017 CLOUDY* CLEAR Final  . Specific Gravity, Urine 01/26/2017 1.030  1.001 - 1.035 Final  . pH 01/26/2017 6.0  5.0 - 8.0 Final  . Glucose, UA 01/26/2017 NEGATIVE  NEGATIVE Final  . Bilirubin Urine 01/26/2017 NEGATIVE  NEGATIVE Final  . Ketones, ur 01/26/2017 NEGATIVE  NEGATIVE Final  . Hgb urine dipstick 01/26/2017 2+* NEGATIVE Final  .  Protein, ur 01/26/2017 1+* NEGATIVE Final  . Nitrite 01/26/2017 POSITIVE* NEGATIVE Final  . Leukocytes, UA 01/26/2017 2+* NEGATIVE Final  . Sodium 01/26/2017 141  135 - 146 mmol/L Final  . Potassium 01/26/2017 4.1  3.5 - 5.3 mmol/L Final  . Chloride 01/26/2017 105  98 - 110 mmol/L Final  . CO2 01/26/2017 25  20 - 31 mmol/L Final  . Glucose, Bld 01/26/2017 105* 70 - 99 mg/dL Final  . BUN 01/26/2017 23  7 - 25 mg/dL Final   . Creat 01/26/2017 1.44* 0.70 - 1.25 mg/dL Final   Comment:   For patients > or = 62 years of age: The upper reference limit for Creatinine is approximately 13% higher for people identified as African-American.     . Total Bilirubin 01/26/2017 0.7  0.2 - 1.2 mg/dL Final  . Alkaline Phosphatase 01/26/2017 36* 40 - 115 U/L Final  . AST 01/26/2017 17  10 - 35 U/L Final  . ALT 01/26/2017 26  9 - 46 U/L Final  . Total Protein 01/26/2017 6.9  6.1 - 8.1 g/dL Final  . Albumin 01/26/2017 4.4  3.6 - 5.1 g/dL Final  . Calcium 01/26/2017 9.4  8.6 - 10.3 mg/dL Final  . GFR, Est African American 01/26/2017 60  >=60 mL/min Final  . GFR, Est Non African American 01/26/2017 52* >=60 mL/min Final  . Cholesterol 01/26/2017 164  <200 mg/dL Final  . Triglycerides 01/26/2017 89  <150 mg/dL Final  . HDL 01/26/2017 49  >40 mg/dL Final  . Total CHOL/HDL Ratio 01/26/2017 3.3  <5.0 Ratio Final  . VLDL 01/26/2017 18  <30 mg/dL Final  . LDL Cholesterol 01/26/2017 97  <100 mg/dL Final  . PSA 01/26/2017 3.5  <=4.0 ng/mL Final   Comment:   The total PSA value from this assay system is standardized against the WHO standard. The test result will be approximately 20% lower when compared to the equimolar-standardized total PSA ([employer] Coul[employer]). Comparison of serial PSA results should be interpreted with this fact in mind.   This test was performed using the Siemens chemiluminescent method. Values obtained from different assay methods cannot be used interchangeably. PSA levels, regardless of value, should not be interpreted as absolute evidence of the presence or absence of disease.     . Hgb A1c MFr Bld 01/26/2017 5.9* <5.7 % Final   Comment:   For someone without known diabetes, a hemoglobin A1c value between 5.7% and 6.4% is consistent with prediabetes and should be confirmed with a follow-up test.   For someone with known diabetes, a value <7% indicates that their diabetes is well controlled. A1c  targets should be individualized based on duration of diabetes, age, co-morbid conditions and other considerations.   This assay result is consistent with an increased risk of diabetes.   Currently, no consensus exists regarding use of hemoglobin A1c for diagnosis of diabetes in children.     . Mean Plasma Glucose 01/26/2017 123  mg/dL Final  . WBC, UA 01/26/2017 >60* <=5 WBC/HPF Final  . RBC / HPF 01/26/2017 10-20* <=2 RBC/HPF Final  . Squamous Epithelial / LPF 01/26/2017 NONE SEEN  <=5 HPF Final  . Bacteria, UA 01/26/2017 MODERATE* NONE SEEN HPF Final  . Crystals 01/26/2017 NONE SEEN  NONE SEEN HPF Final  . Casts 01/26/2017 NONE SEEN  NONE SEEN LPF Final  . Yeast 01/26/2017 NONE SEEN  NONE SEEN HPF Final  . Culture 01/26/2017 STAPHYLOCOCCUS AUREUS   Final   Comment: Rifampin and Gentamicin should  not be used as single drugs for treatment of Staph infections.   . Colony Count 01/26/2017 Greater than 100,000 CFU/mL   Final  . Organism ID, Bacteria 01/26/2017 STAPHYLOCOCCUS AUREUS   Final   Patient has a history of prostatitis Past Medical History:  Diagnosis Date  . Colon polyps   . GERD (gastroesophageal reflux disease)   . Hyperlipidemia   . Hypertension   . Kidney stones   . Pneumonia   . Prediabetes    Current Outpatient Prescriptions on File Prior to Visit  Medication Sig Dispense Refill  . aspirin 81 MG tablet Take 81 mg by mouth daily.      . ciprofloxacin (CIPRO) 500 MG tablet Take 1 tablet (500 mg total) by mouth 2 (two) times daily. 6 tablet 0  . Fexofenadine HCl (ALLEGRA PO) Take by mouth 1 dose over 46 hours.      . fish oil-omega-3 fatty acids 1000 MG capsule Take 2 g by mouth daily.    Marland Kitchen gabapentin (NEURONTIN) 300 MG capsule TAKE 1 CAPSULE(S) BY MOUTH AT BEDTIME 30 capsule 5  . losartan-hydrochlorothiazide (HYZAAR) 100-12.5 MG tablet TAKE ONE TABLET BY MOUTH DAILY 90 tablet 2  . Probiotic Product (PROBIOTIC DAILY PO) Take by mouth.    . vitamin B-12  (CYANOCOBALAMIN) 1000 MCG tablet Take 1,000 mcg by mouth daily.     No current facility-administered medications on file prior to visit.    Past Surgical History:  Procedure Laterality Date  . ARTHROSCOPIC REPAIR ACL    . CERVICAL SPINE SURGERY    . HERNIA REPAIR  1979  . KNEE SURGERY     RIGHT KNEE  . LITHOTRIPSY  08 & 09  . NECK SURGERY     Allergies  Allergen Reactions  . Codeine Other (See Comments)    Nausea and passing out  . Penicillins    Social History   Social History  . Marital status: Married    Spouse name: N/A  . Number of children: N/A  . Years of education: N/A   Occupational History  . lineman    Social History Main Topics  . Smoking status: Never Smoker  . Smokeless tobacco: Never Used  . Alcohol use No  . Drug use: No  . Sexual activity: Yes     Comment: married, owns an Hy[employer]   Other Topics Concern  . Not on file   Social History Narrative  . No narrative on file   Family History  Problem Relation Age of Onset  . Colon cancer Father   . Colon polyps Father   . Heart disease Father   . Cancer Father        colon  . Stomach cancer Mother   . Cancer Mother        brain  . Leukemia Sister   . Cancer Sister        leukemia     Review of Systems  All other systems reviewed and are negative.      Objective:   Physical Exam  Constitutional: He is oriented to person, place, and time. He appears well-developed and well-nourished. No distress.  HENT:  Head: Normocephalic and atraumatic.  Right Ear: External ear normal.  Left Ear: External ear normal.  Nose: Nose normal.  Mouth/Throat: Oropharynx is clear and moist. No oropharyngeal exudate.  Eyes: Conjunctivae and EOM are normal. Pupils are equal, round, and reactive to light. Right eye exhibits no discharge. Left eye exhibits no discharge. No scleral icterus.  Neck: Normal range of motion. Neck supple. No JVD present. No tracheal deviation present. No  thyromegaly present.  Cardiovascular: Normal rate, regular rhythm and normal heart sounds.  Exam reveals no gallop and no friction rub.   No murmur heard. Pulmonary/Chest: Effort normal and breath sounds normal. No stridor. No respiratory distress. He has no wheezes. He has no rales. He exhibits no tenderness.  Abdominal: Soft. Bowel sounds are normal. He exhibits no distension and no mass. There is no tenderness. There is no rebound and no guarding.  Musculoskeletal: He exhibits no edema, tenderness or deformity.  Lymphadenopathy:    He has no cervical adenopathy.  Neurological: He is alert and oriented to person, place, and time. He has normal reflexes. He displays normal reflexes. No cranial nerve deficit. He exhibits normal muscle tone. Coordination normal.  Skin: Skin is warm. No rash noted. He is not diaphoretic. No erythema. No pallor.  Psychiatric: He has a normal mood and affect. His behavior is normal. Judgment and thought content normal.  Vitals reviewed.         Assessment & Plan:  General medical exam  Prediabetes  Pure hypercholesterolemia  Benign prostatic hyperplasia without lower urinary tract symptoms  Recurrent UTI  I recommended that the patient drink more water. I would like to recheck his creatinine after he is making an effort to drink 3-4 bottles of water a day, finish his treatment for chronic prostatitis, and had adequate treatment of his urinary tract infection. I suspect that his PSA and his creatinine would improve after that. His immunizations are up-to-date. Cancer screenings up-to-date. I have deferred a rectal exam/PSA because the patient is going to see a urologist. My working hypothesis is that the patient has chronic prostatitis causing his PSA to rise seeding his bladder and his lack of drinking adequate amounts of water is triggering his recurrent urinary tract infections because he is not flushing his bladder completely. I also believe he has an  element of BPH. We discussed this at length today in his office visit. The remainder of his lab work is excellent. I have called the urology office and left a message. I asked the patient to touch base with me in 48 hours if he has not heard from that office.

## 2017-03-27 DIAGNOSIS — N281 Cyst of kidney, acquired: Secondary | ICD-10-CM | POA: Diagnosis not present

## 2017-03-27 DIAGNOSIS — N200 Calculus of kidney: Secondary | ICD-10-CM | POA: Diagnosis not present

## 2017-03-27 DIAGNOSIS — R972 Elevated prostate specific antigen [PSA]: Secondary | ICD-10-CM | POA: Diagnosis not present

## 2017-03-27 DIAGNOSIS — R310 Gross hematuria: Secondary | ICD-10-CM | POA: Diagnosis not present

## 2017-04-19 ENCOUNTER — Other Ambulatory Visit: Payer: Self-pay | Admitting: Family Medicine

## 2017-05-01 DIAGNOSIS — L570 Actinic keratosis: Secondary | ICD-10-CM | POA: Diagnosis not present

## 2017-05-01 DIAGNOSIS — L821 Other seborrheic keratosis: Secondary | ICD-10-CM | POA: Diagnosis not present

## 2017-05-01 DIAGNOSIS — D1801 Hemangioma of skin and subcutaneous tissue: Secondary | ICD-10-CM | POA: Diagnosis not present

## 2017-05-01 DIAGNOSIS — Z85828 Personal history of other malignant neoplasm of skin: Secondary | ICD-10-CM | POA: Diagnosis not present

## 2017-05-01 DIAGNOSIS — B078 Other viral warts: Secondary | ICD-10-CM | POA: Diagnosis not present

## 2017-10-15 ENCOUNTER — Other Ambulatory Visit: Payer: Self-pay | Admitting: Family Medicine

## 2018-02-06 ENCOUNTER — Other Ambulatory Visit: Payer: Self-pay | Admitting: Family Medicine

## 2018-03-21 ENCOUNTER — Other Ambulatory Visit: Payer: BLUE CROSS/BLUE SHIELD

## 2018-03-21 DIAGNOSIS — Z0000 Encounter for general adult medical examination without abnormal findings: Secondary | ICD-10-CM | POA: Diagnosis not present

## 2018-03-21 DIAGNOSIS — E119 Type 2 diabetes mellitus without complications: Secondary | ICD-10-CM

## 2018-03-21 DIAGNOSIS — Z125 Encounter for screening for malignant neoplasm of prostate: Secondary | ICD-10-CM | POA: Diagnosis not present

## 2018-03-22 LAB — HEMOGLOBIN A1C
EAG (MMOL/L): 7.4 (calc)
Hgb A1c MFr Bld: 6.3 % of total Hgb — ABNORMAL HIGH (ref <5.7)
MEAN PLASMA GLUCOSE: 134 (calc)

## 2018-03-22 LAB — COMPREHENSIVE METABOLIC PANEL
AG RATIO: 2 (calc) (ref 1.0–2.5)
ALKALINE PHOSPHATASE (APISO): 32 U/L — AB (ref 40–115)
ALT: 31 U/L (ref 9–46)
AST: 20 U/L (ref 10–35)
Albumin: 4.6 g/dL (ref 3.6–5.1)
BUN/Creatinine Ratio: 14 (calc) (ref 6–22)
BUN: 20 mg/dL (ref 7–25)
CO2: 28 mmol/L (ref 20–32)
Calcium: 9.7 mg/dL (ref 8.6–10.3)
Chloride: 102 mmol/L (ref 98–110)
Creat: 1.39 mg/dL — ABNORMAL HIGH (ref 0.70–1.25)
Globulin: 2.3 g/dL (calc) (ref 1.9–3.7)
Glucose, Bld: 126 mg/dL — ABNORMAL HIGH (ref 65–99)
Potassium: 4.4 mmol/L (ref 3.5–5.3)
Sodium: 140 mmol/L (ref 135–146)
Total Bilirubin: 0.9 mg/dL (ref 0.2–1.2)
Total Protein: 6.9 g/dL (ref 6.1–8.1)

## 2018-03-22 LAB — LIPID PANEL
CHOL/HDL RATIO: 3.1 (calc) (ref <5.0)
Cholesterol: 154 mg/dL (ref <200)
HDL: 50 mg/dL (ref >40)
LDL CHOLESTEROL (CALC): 89 mg/dL
Non-HDL Cholesterol (Calc): 104 mg/dL (calc) (ref <130)
Triglycerides: 61 mg/dL (ref <150)

## 2018-03-22 LAB — PSA: PSA: 2.1 ng/mL (ref <=4.0)

## 2018-03-27 ENCOUNTER — Encounter: Payer: Self-pay | Admitting: Family Medicine

## 2018-03-27 ENCOUNTER — Ambulatory Visit (INDEPENDENT_AMBULATORY_CARE_PROVIDER_SITE_OTHER): Payer: BLUE CROSS/BLUE SHIELD | Admitting: Family Medicine

## 2018-03-27 VITALS — BP 140/80 | HR 66 | Temp 97.6°F | Resp 14 | Ht 70.0 in | Wt 194.0 lb

## 2018-03-27 DIAGNOSIS — R7303 Prediabetes: Secondary | ICD-10-CM | POA: Diagnosis not present

## 2018-03-27 DIAGNOSIS — Z0000 Encounter for general adult medical examination without abnormal findings: Secondary | ICD-10-CM | POA: Diagnosis not present

## 2018-03-27 DIAGNOSIS — E7800 Pure hypercholesterolemia, unspecified: Secondary | ICD-10-CM | POA: Diagnosis not present

## 2018-03-27 DIAGNOSIS — R5382 Chronic fatigue, unspecified: Secondary | ICD-10-CM | POA: Diagnosis not present

## 2018-03-27 DIAGNOSIS — R6889 Other general symptoms and signs: Secondary | ICD-10-CM | POA: Diagnosis not present

## 2018-03-27 DIAGNOSIS — Z1329 Encounter for screening for other suspected endocrine disorder: Secondary | ICD-10-CM | POA: Diagnosis not present

## 2018-03-27 DIAGNOSIS — Z1389 Encounter for screening for other disorder: Secondary | ICD-10-CM | POA: Diagnosis not present

## 2018-03-27 MED ORDER — CYCLOBENZAPRINE HCL 10 MG PO TABS: 10.0000 mg | ORAL_TABLET | Freq: Three times a day (TID) | ORAL | 0 refills | Status: DC | PRN

## 2018-03-27 NOTE — Progress Notes (Signed)
Subjective:    Patient ID: Joseph Tucker, male    DOB: 04-17-1956, 62 y.o.   MRN: [id number]  HPI  Patient is here today for complete physical exam.  His most recent lab work is listed below.  He does report chronic fatigue.  He states that he has no energy.  He feels extremely tired at the end of the day.  He has been attributing this to his age however he request a B12 level and his testosterone be checked.  He denies any chest pain shortness of breath or dyspnea on exertion.  He is due for a flu shot.  He is due for Pneumovax 23.  He defers this until September.  He declines HIV screening and hepatitis C screening.  His prostate cancer screening test/PSA is improved this year compared to last year.  His colonoscopy was performed in 2012 and is up-to-date.  He is overdue for diabetic eye exam but he declines allowing me to schedule him for an ophthalmologist today because he is extremely busy at work and cannot afford to miss work right now.  He states that he will schedule this on his own when his time allows. Immunization History  Administered Date(s) Administered  . Td 04/17/2001  . Tdap 09/19/2013  . Zoster 07/16/2015    No visits with results within 1 Day(s) from this visit.  Latest known visit with results is:  Lab on 03/21/2018  Component Date Value Ref Range Status  . Cholesterol 03/21/2018 154  <200 mg/dL Final  . HDL 03/21/2018 50  >40 mg/dL Final  . Triglycerides 03/21/2018 61  <150 mg/dL Final  . LDL Cholesterol (Calc) 03/21/2018 89  mg/dL (calc) Final   Comment: Reference range: <100 . Desirable range <100 mg/dL for primary prevention;   <70 mg/dL for patients with CHD or diabetic patients  with > or = 2 CHD risk factors. Marland Kitchen LDL-C is now calculated using the Martin-Hopkins  calculation, which is a validated novel method providing  better accuracy than the Friedewald equation in the  estimation of LDL-C.  Cresenciano Genre et al. Annamaria Helling. 0160;109(32): 2061-2068   (http://education.[facility].com/faq/FAQ164)   . Total CHOL/HDL Ratio 03/21/2018 3.1  <5.0 (calc) Final  . Non-HDL Cholesterol (Calc) 03/21/2018 104  <130 mg/dL (calc) Final   Comment: For patients with diabetes plus 1 major ASCVD risk  factor, treating to a non-HDL-C goal of <100 mg/dL  (LDL-C of <70 mg/dL) is considered a therapeutic  option.   Marland Kitchen PSA 03/21/2018 2.1  < OR = 4.0 ng/mL Final   Comment: The total PSA value from this assay system is  standardized against the WHO standard. The test  result will be approximately 20% lower when compared  to the equimolar-standardized total PSA (Beckman  Coulter). Comparison of serial PSA results should be  interpreted with this fact in mind. . This test was performed using the Siemens  chemiluminescent method. Values obtained from  different assay methods cannot be used interchangeably. PSA levels, regardless of value, should not be interpreted as absolute evidence of the presence or absence of disease.   . Glucose, Bld 03/21/2018 126* 65 - 99 mg/dL Final   Comment: .            Fasting reference interval . For someone without known diabetes, a glucose value >125 mg/dL indicates that they may have diabetes and this should be confirmed with a follow-up test. .   . BUN 03/21/2018 20  7 - 25 mg/dL Final  .  Creat 03/21/2018 1.39* 0.70 - 1.25 mg/dL Final   Comment: For patients >64 years of age, the reference limit for Creatinine is approximately 13% higher for people identified as African-American. .   Havery Moros Ratio 03/21/2018 14  6 - 22 (calc) Final  . Sodium 03/21/2018 140  135 - 146 mmol/L Final  . Potassium 03/21/2018 4.4  3.5 - 5.3 mmol/L Final  . Chloride 03/21/2018 102  98 - 110 mmol/L Final  . CO2 03/21/2018 28  20 - 32 mmol/L Final  . Calcium 03/21/2018 9.7  8.6 - 10.3 mg/dL Final  . Total Protein 03/21/2018 6.9  6.1 - 8.1 g/dL Final  . Albumin 03/21/2018 4.6  3.6 - 5.1 g/dL Final  . Globulin  03/21/2018 2.3  1.9 - 3.7 g/dL (calc) Final  . AG Ratio 03/21/2018 2.0  1.0 - 2.5 (calc) Final  . Total Bilirubin 03/21/2018 0.9  0.2 - 1.2 mg/dL Final  . Alkaline phosphatase (APISO) 03/21/2018 32* 40 - 115 U/L Final  . AST 03/21/2018 20  10 - 35 U/L Final  . ALT 03/21/2018 31  9 - 46 U/L Final  . Hgb A1c MFr Bld 03/21/2018 6.3* <5.7 % of total Hgb Final   Comment: For someone without known diabetes, a hemoglobin  A1c value between 5.7% and 6.4% is consistent with prediabetes and should be confirmed with a  follow-up test. . For someone with known diabetes, a value <7% indicates that their diabetes is well controlled. A1c targets should be individualized based on duration of diabetes, age, comorbid conditions, and other considerations. . This assay result is consistent with an increased risk of diabetes. . Currently, no consensus exists regarding use of hemoglobin A1c for diagnosis of diabetes for children. .   . Mean Plasma Glucose 03/21/2018 134  (calc) Final  . eAG (mmol/L) 03/21/2018 7.4  (calc) Final   Patient has a history of prostatitis Past Medical History:  Diagnosis Date  . Colon polyps   . GERD (gastroesophageal reflux disease)   . Hyperlipidemia   . Hypertension   . Kidney stones   . Pneumonia   . Prediabetes    Current Outpatient Medications on File Prior to Visit  Medication Sig Dispense Refill  . aspirin 81 MG tablet Take 81 mg by mouth daily.      Marland Kitchen Fexofenadine HCl (ALLEGRA PO) Take by mouth 1 dose over 46 hours.      . fish oil-omega-3 fatty acids 1000 MG capsule Take 2 g by mouth daily.    Marland Kitchen gabapentin (NEURONTIN) 300 MG capsule TAKE 1 CAPSULE(S) BY MOUTH AT BEDTIME 90 capsule 4  . losartan-hydrochlorothiazide (HYZAAR) 100-12.5 MG tablet TAKE ONE TABLET BY MOUTH DAILY 90 tablet 1  . pravastatin (PRAVACHOL) 20 MG tablet TAKE ONE TABLET BY MOUTH DAILY 90 tablet 1  . vitamin B-12 (CYANOCOBALAMIN) 1000 MCG tablet Take 1,000 mcg by mouth daily.     No  current facility-administered medications on file prior to visit.    Past Surgical History:  Procedure Laterality Date  . ARTHROSCOPIC REPAIR ACL    . CERVICAL SPINE SURGERY    . HERNIA REPAIR  1979  . KNEE SURGERY     RIGHT KNEE  . LITHOTRIPSY  08 & 09  . NECK SURGERY     Allergies  Allergen Reactions  . Codeine Other (See Comments)    Nausea and passing out  . Penicillins    Social History   Socioeconomic History  . Marital status: Married  Spouse name: Not on file  . Number of children: Not on file  . Years of education: Not on file  . Highest education level: Not on file  Occupational History  . Occupation: lineman  Social Needs  . Financial resource strain: Not on file  . Food insecurity:    Worry: Not on file    Inability: Not on file  . Transportation needs:    Medical: Not on file    Non-medical: Not on file  Tobacco Use  . Smoking status: Never Smoker  . Smokeless tobacco: Never Used  Substance and Sexual Activity  . Alcohol use: No  . Drug use: No  . Sexual activity: Yes    Comment: married, owns an [employer]  Lifestyle  . Physical activity:    Days per week: Not on file    Minutes per session: Not on file  . Stress: Not on file  Relationships  . Social connections:    Talks on phone: Not on file    Gets together: Not on file    Attends religious service: Not on file    Active member of club or organization: Not on file    Attends meetings of clubs or organizations: Not on file    Relationship status: Not on file  . Intimate partner violence:    Fear of current or ex partner: Not on file    Emotionally abused: Not on file    Physically abused: Not on file    Forced sexual activity: Not on file  Other Topics Concern  . Not on file  Social History Narrative  . Not on file   Family History  Problem Relation Age of Onset  . Colon cancer Father   . Colon polyps Father   . Heart disease Father   . Cancer Father         colon  . Stomach cancer Mother   . Cancer Mother        brain  . Leukemia Sister   . Cancer Sister        leukemia     Review of Systems  All other systems reviewed and are negative.      Objective:   Physical Exam  Constitutional: He is oriented to person, place, and time. He appears well-developed and well-nourished. No distress.  HENT:  Head: Normocephalic and atraumatic.  Right Ear: External ear normal.  Left Ear: External ear normal.  Nose: Nose normal.  Mouth/Throat: Oropharynx is clear and moist. No oropharyngeal exudate.  Eyes: Pupils are equal, round, and reactive to light. Conjunctivae and EOM are normal. Right eye exhibits no discharge. Left eye exhibits no discharge. No scleral icterus.  Neck: Normal range of motion. Neck supple. No JVD present. No tracheal deviation present. No thyromegaly present.  Cardiovascular: Normal rate, regular rhythm and normal heart sounds. Exam reveals no gallop and no friction rub.  No murmur heard. Pulmonary/Chest: Effort normal and breath sounds normal. No stridor. No respiratory distress. He has no wheezes. He has no rales. He exhibits no tenderness.  Abdominal: Soft. Bowel sounds are normal. He exhibits no distension and no mass. There is no tenderness. There is no rebound and no guarding.  Musculoskeletal: He exhibits no edema, tenderness or deformity.  Lymphadenopathy:    He has no cervical adenopathy.  Neurological: He is alert and oriented to person, place, and time. He has normal reflexes. No cranial nerve deficit. He exhibits normal muscle tone. Coordination normal.  Skin: Skin is warm.  No rash noted. He is not diaphoretic. No erythema. No pallor.  Psychiatric: He has a normal mood and affect. His behavior is normal. Judgment and thought content normal.  Vitals reviewed.         Assessment & Plan:  Chronic fatigue - Plan: Testosterone Total,Free,Bio, Males, TSH, Vitamin B12  General medical exam  Prediabetes  Pure  hypercholesterolemia  Patient's physical exam is completely normal today.  Diabetic foot exam is normal.  Because of his fatigue I will check a testosterone level, TSH, and vitamin B12.  The remainder of his lab work is excellent aside from some mild stable stage III chronic kidney disease.  I recommended that he stay away from NSAIDs and drink plenty of water.  I recommended Pneumovax 23 but he declined that today.  I recommended a flu shot but he declined that.  He states that he will return in the fall to receive his flu shot and Pneumovax 23 at the same time.  His colonoscopy is up-to-date.  His PSA has improved.  His cholesterol and hemoglobin A1c are excellent.  His blood pressure is acceptable.  Reassess in 6 months or as needed.  I recommended a diabetic eye exam but the patient states that he will schedule this on his own.

## 2018-03-29 LAB — TESTOSTERONE TOTAL,FREE,BIO, MALES
ALBUMIN MSPROF: 4.5 g/dL (ref 3.6–5.1)
Sex Hormone Binding: 22 nmol/L (ref 22–77)
TESTOSTERONE BIOAVAILABLE: 96.1 ng/dL — AB (ref >=110.0)
Testosterone, Free: 46.7 pg/mL (ref 46.0–224.0)
Testosterone: 273 ng/dL (ref 250–827)

## 2018-03-29 LAB — TSH: TSH: 2.58 mIU/L (ref 0.40–4.50)

## 2018-03-29 LAB — VITAMIN B12: Vitamin B-12: >2000 pg/mL — ABNORMAL HIGH (ref 200–1100)

## 2018-04-11 ENCOUNTER — Other Ambulatory Visit: Payer: Self-pay | Admitting: Family Medicine

## 2018-04-22 ENCOUNTER — Other Ambulatory Visit: Payer: Self-pay | Admitting: Family Medicine

## 2018-05-01 DIAGNOSIS — L820 Inflamed seborrheic keratosis: Secondary | ICD-10-CM | POA: Diagnosis not present

## 2018-05-01 DIAGNOSIS — L570 Actinic keratosis: Secondary | ICD-10-CM | POA: Diagnosis not present

## 2018-05-01 DIAGNOSIS — Z85828 Personal history of other malignant neoplasm of skin: Secondary | ICD-10-CM | POA: Diagnosis not present

## 2018-05-01 DIAGNOSIS — L821 Other seborrheic keratosis: Secondary | ICD-10-CM | POA: Diagnosis not present

## 2018-05-01 DIAGNOSIS — D485 Neoplasm of uncertain behavior of skin: Secondary | ICD-10-CM | POA: Diagnosis not present

## 2018-07-11 ENCOUNTER — Other Ambulatory Visit: Payer: Self-pay | Admitting: Family Medicine

## 2018-08-06 ENCOUNTER — Other Ambulatory Visit: Payer: Self-pay | Admitting: Family Medicine

## 2018-10-12 ENCOUNTER — Other Ambulatory Visit: Payer: Self-pay | Admitting: Family Medicine

## 2018-11-05 ENCOUNTER — Other Ambulatory Visit: Payer: Self-pay | Admitting: Family Medicine

## 2018-11-17 ENCOUNTER — Other Ambulatory Visit: Payer: Self-pay | Admitting: Family Medicine

## 2018-11-19 NOTE — Telephone Encounter (Signed)
Ok to refill 

## 2019-01-13 ENCOUNTER — Other Ambulatory Visit: Payer: Self-pay | Admitting: Family Medicine

## 2019-04-10 ENCOUNTER — Other Ambulatory Visit: Payer: Self-pay | Admitting: Family Medicine

## 2019-05-02 DIAGNOSIS — D2261 Melanocytic nevi of right upper limb, including shoulder: Secondary | ICD-10-CM | POA: Diagnosis not present

## 2019-05-02 DIAGNOSIS — Z85828 Personal history of other malignant neoplasm of skin: Secondary | ICD-10-CM | POA: Diagnosis not present

## 2019-05-02 DIAGNOSIS — L570 Actinic keratosis: Secondary | ICD-10-CM | POA: Diagnosis not present

## 2019-05-02 DIAGNOSIS — D2262 Melanocytic nevi of left upper limb, including shoulder: Secondary | ICD-10-CM | POA: Diagnosis not present

## 2019-07-10 ENCOUNTER — Other Ambulatory Visit: Payer: Self-pay | Admitting: Family Medicine

## 2019-09-26 ENCOUNTER — Other Ambulatory Visit: Payer: Self-pay | Admitting: Family Medicine

## 2019-10-03 ENCOUNTER — Encounter: Payer: Self-pay | Admitting: Family Medicine

## 2019-10-03 ENCOUNTER — Other Ambulatory Visit: Payer: Self-pay

## 2019-10-03 ENCOUNTER — Ambulatory Visit (INDEPENDENT_AMBULATORY_CARE_PROVIDER_SITE_OTHER): Payer: BC Managed Care – PPO | Admitting: Family Medicine

## 2019-10-03 VITALS — BP 142/90 | HR 65 | Temp 96.7°F | Resp 16 | Ht 70.0 in | Wt 200.0 lb

## 2019-10-03 DIAGNOSIS — R5382 Chronic fatigue, unspecified: Secondary | ICD-10-CM

## 2019-10-03 DIAGNOSIS — Z125 Encounter for screening for malignant neoplasm of prostate: Secondary | ICD-10-CM

## 2019-10-03 DIAGNOSIS — Z8739 Personal history of other diseases of the musculoskeletal system and connective tissue: Secondary | ICD-10-CM

## 2019-10-03 DIAGNOSIS — E7800 Pure hypercholesterolemia, unspecified: Secondary | ICD-10-CM

## 2019-10-03 DIAGNOSIS — E118 Type 2 diabetes mellitus with unspecified complications: Secondary | ICD-10-CM | POA: Diagnosis not present

## 2019-10-03 DIAGNOSIS — Z0000 Encounter for general adult medical examination without abnormal findings: Secondary | ICD-10-CM

## 2019-10-03 DIAGNOSIS — R6889 Other general symptoms and signs: Secondary | ICD-10-CM | POA: Diagnosis not present

## 2019-10-03 NOTE — Progress Notes (Signed)
Subjective:    Patient ID: Joseph Tucker, male    DOB: 17-Sep-1955, 64 y.o.   MRN: [id number]  HPI  Patient is here today for complete physical exam.  He is due for a diabetic foot exam which was performed today and is normal.  He is due for a flu shot which he politely declines.  He is due for HIV and hepatitis C screening which he politely declines.  I recommended a diabetic eye exam.  He does report some tinnitus related to hearing loss.  He also occasionally has some orthostatic dizziness if standing from a squatting position.  There is also a 1 cm patch of what appears to be early ringworm developing on the dorsum of his left great toe which I recommended Lotrimin for twice a day for 1 week.  Otherwise he is doing well with no concerns.  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  He denies any chest pain shortness of breath or dyspnea on exertion.  He denies any myalgias right upper quadrant pain.  He denies any polyuria, polydipsia, or blurry vision.  Colonoscopy is up-to-date.  So they brought him appear Immunization History  Administered Date(s) Administered  . Td 04/17/2001  . Tdap 09/19/2013  . Zoster 07/16/2015    Past Medical History:  Diagnosis Date  . Colon polyps   . GERD (gastroesophageal reflux disease)   . Hyperlipidemia   . Hypertension   . Kidney stones   . Pneumonia   . Prediabetes    Current Outpatient Medications on File Prior to Visit  Medication Sig Dispense Refill  . aspirin 81 MG tablet Take 81 mg by mouth daily.      . cyclobenzaprine (FLEXERIL) 10 MG tablet TAKE ONE TABLET BY MOUTH THREE TIMES A DAY AS NEEDED FOR MUSCLE SPASMS 30 tablet 0  . Fexofenadine HCl (ALLEGRA PO) Take by mouth 1 dose over 46 hours.      . fish oil-omega-3 fatty acids 1000 MG capsule Take 2 g by mouth daily.    Marland Kitchen gabapentin (NEURONTIN) 300 MG capsule TAKE ONE CAPSULE BY MOUTH AT  BEDTIME 90 capsule 2  . losartan-hydrochlorothiazide (HYZAAR) 100-12.5 MG tablet TAKE ONE TABLET BY MOUTH DAILY 90 tablet 3  . pravastatin (PRAVACHOL) 20 MG tablet Take 1 tablet (20 mg total) by mouth daily. 30 tablet 0  . vitamin B-12 (CYANOCOBALAMIN) 1000 MCG tablet Take 1,000 mcg by mouth daily.     No current facility-administered medications on file prior to visit.   Past Surgical History:  Procedure Laterality Date  . ARTHROSCOPIC REPAIR ACL    . CERVICAL SPINE SURGERY    . HERNIA REPAIR  1979  . KNEE SURGERY     RIGHT KNEE  . LITHOTRIPSY  08 & 09  . NECK SURGERY     Allergies  Allergen Reactions  . Codeine Other (See Comments)    Nausea and passing out  . Penicillins    Social History   Socioeconomic History  . Marital status: Married    Spouse name: Not on file  . Number of children: Not on file  . Years of education: Not on file  . Highest education level: Not on file  Occupational History  . Occupation: lineman  Tobacco Use  . Smoking status: Never Smoker  . Smokeless tobacco: Never Used  Substance and Sexual Activity  . Alcohol use: No  .  Drug use: No  . Sexual activity: Yes    Comment: married, owns an Hydrologist  Other Topics Concern  . Not on file  Social History Narrative  . Not on file   Social Determinants of Health   Financial Resource Strain:   . Difficulty of Paying Living Expenses: Not on file  Food Insecurity:   . Worried About Charity fundraiser in the Last Year: Not on file  . Ran Out of Food in the Last Year: Not on file  Transportation Needs:   . Lack of Transportation (Medical): Not on file  . Lack of Transportation (Non-Medical): Not on file  Physical Activity:   . Days of Exercise per Week: Not on file  . Minutes of Exercise per Session: Not on file  Stress:   . Feeling of Stress : Not on file  Social Connections:   . Frequency of Communication with Friends and Family: Not on file  . Frequency of Social  Gatherings with Friends and Family: Not on file  . Attends Religious Services: Not on file  . Active Member of Clubs or Organizations: Not on file  . Attends Archivist Meetings: Not on file  . Marital Status: Not on file  Intimate Partner Violence:   . Fear of Current or Ex-Partner: Not on file  . Emotionally Abused: Not on file  . Physically Abused: Not on file  . Sexually Abused: Not on file   Family History  Problem Relation Age of Onset  . Colon cancer Father   . Colon polyps Father   . Heart disease Father   . Cancer Father        colon  . Stomach cancer Mother   . Cancer Mother        brain  . Leukemia Sister   . Cancer Sister        leukemia     Review of Systems  All other systems reviewed and are negative.      Objective:   Physical Exam  Constitutional: He is oriented to person, place, and time. He appears well-developed and well-nourished. No distress.  HENT:  Head: Normocephalic and atraumatic.  Right Ear: External ear normal.  Left Ear: External ear normal.  Nose: Nose normal.  Mouth/Throat: Oropharynx is clear and moist. No oropharyngeal exudate.  Eyes: Pupils are equal, round, and reactive to light. Conjunctivae and EOM are normal. Right eye exhibits no discharge. Left eye exhibits no discharge. No scleral icterus.  Neck: No JVD present. No tracheal deviation present. No thyromegaly present.  Cardiovascular: Normal rate, regular rhythm and normal heart sounds. Exam reveals no gallop and no friction rub.  No murmur heard. Pulmonary/Chest: Effort normal and breath sounds normal. No stridor. No respiratory distress. He has no wheezes. He has no rales. He exhibits no tenderness.  Abdominal: Soft. Bowel sounds are normal. He exhibits no distension and no mass. There is no abdominal tenderness. There is no rebound and no guarding.  Musculoskeletal:        General: No tenderness, deformity or edema.     Cervical back: Normal range of motion and neck  supple.  Lymphadenopathy:    He has no cervical adenopathy.  Neurological: He is alert and oriented to person, place, and time. He has normal reflexes. No cranial nerve deficit. He exhibits normal muscle tone. Coordination normal.  Skin: Skin is warm. No rash noted. He is not diaphoretic. No erythema. No pallor.  Psychiatric: He has a normal mood  and affect. His behavior is normal. Judgment and thought content normal.  Vitals reviewed.         Assessment & Plan:  Controlled type 2 diabetes mellitus with complication, without long-term current use of insulin (HCC) - Plan: Hemoglobin A1c, CBC with Differential/Platelet, COMPLETE METABOLIC PANEL WITH GFR, Lipid panel, Microalbumin, urine  Prostate cancer screening - Plan: PSA  History of gout - Plan: Uric acid  General medical exam  Pure hypercholesterolemia  Patient's physical exam today is completely normal.  His blood pressure is acceptable.  I will check a CBC, CMP, fasting lipid panel.  Goal LDL cholesterol is well below 100 given his prediabetes.  I will also check a hemoglobin A1c.  Ideally I like his hemoglobin A1c to be less than 6.5.  I will screen for prostate cancer with a PSA.  I will check for diabetic nephropathy with a urine microalbumin.  He has had 2 gout attacks over the last year.  Therefore I will check a uric acid level and if greater than 6, we can consider starting allopurinol as a preventative.  He will think about this.  Colonoscopy is up-to-date.  Recommended a flu shot which she politely declined.  He also declines HIV and hepatitis C screening.  He has not yet due for Pneumovax 23.  Recommended the COVID-19 vaccination.

## 2019-10-03 NOTE — Addendum Note (Signed)
Addended by: Shary Decamp B on: 10/03/2019 09:44 AM   Modules accepted: Orders

## 2019-10-04 LAB — COMPLETE METABOLIC PANEL WITH GFR
AG Ratio: 2.3 (calc) (ref 1.0–2.5)
ALT: 29 U/L (ref 9–46)
AST: 18 U/L (ref 10–35)
Albumin: 4.6 g/dL (ref 3.6–5.1)
Alkaline phosphatase (APISO): 36 U/L (ref 35–144)
BUN/Creatinine Ratio: 12 (calc) (ref 6–22)
BUN: 15 mg/dL (ref 7–25)
CO2: 29 mmol/L (ref 20–32)
Calcium: 9.1 mg/dL (ref 8.6–10.3)
Chloride: 104 mmol/L (ref 98–110)
Creat: 1.28 mg/dL — ABNORMAL HIGH (ref 0.70–1.25)
GFR, Est African American: 68 mL/min/{1.73_m2} (ref >=60)
GFR, Est Non African American: 59 mL/min/{1.73_m2} — ABNORMAL LOW (ref >=60)
Globulin: 2 g/dL (calc) (ref 1.9–3.7)
Glucose, Bld: 121 mg/dL — ABNORMAL HIGH (ref 65–99)
Potassium: 4.4 mmol/L (ref 3.5–5.3)
Sodium: 142 mmol/L (ref 135–146)
Total Bilirubin: 0.8 mg/dL (ref 0.2–1.2)
Total Protein: 6.6 g/dL (ref 6.1–8.1)

## 2019-10-04 LAB — CBC WITH DIFFERENTIAL/PLATELET
Absolute Monocytes: 475 cells/uL (ref 200–950)
Basophils Absolute: 58 cells/uL (ref 0–200)
Basophils Relative: 1.2 %
Eosinophils Absolute: 158 cells/uL (ref 15–500)
Eosinophils Relative: 3.3 %
HCT: 42.7 % (ref 38.5–50.0)
Hemoglobin: 14 g/dL (ref 13.2–17.1)
Lymphs Abs: 1272 cells/uL (ref 850–3900)
MCH: 28.7 pg (ref 27.0–33.0)
MCHC: 32.8 g/dL (ref 32.0–36.0)
MCV: 87.5 fL (ref 80.0–100.0)
MPV: 10 fL (ref 7.5–12.5)
Monocytes Relative: 9.9 %
Neutro Abs: 2837 cells/uL (ref 1500–7800)
Neutrophils Relative %: 59.1 %
Platelets: 284 10*3/uL (ref 140–400)
RBC: 4.88 10*6/uL (ref 4.20–5.80)
RDW: 13.3 % (ref 11.0–15.0)
Total Lymphocyte: 26.5 %
WBC: 4.8 10*3/uL (ref 3.8–10.8)

## 2019-10-04 LAB — LIPID PANEL
Cholesterol: 176 mg/dL (ref <200)
HDL: 45 mg/dL (ref >=40)
LDL Cholesterol (Calc): 111 mg/dL (calc) — ABNORMAL HIGH
Non-HDL Cholesterol (Calc): 131 mg/dL (calc) — ABNORMAL HIGH (ref <130)
Total CHOL/HDL Ratio: 3.9 (calc) (ref <5.0)
Triglycerides: 94 mg/dL (ref <150)

## 2019-10-04 LAB — URIC ACID: Uric Acid, Serum: 8.3 mg/dL — ABNORMAL HIGH (ref 4.0–8.0)

## 2019-10-04 LAB — MICROALBUMIN, URINE: Microalb, Ur: 0.3 mg/dL

## 2019-10-04 LAB — PSA: PSA: 1.4 ng/mL (ref <=4.0)

## 2019-10-04 LAB — HEMOGLOBIN A1C
Hgb A1c MFr Bld: 6.4 % of total Hgb — ABNORMAL HIGH (ref <5.7)
Mean Plasma Glucose: 137 (calc)
eAG (mmol/L): 7.6 (calc)

## 2019-10-04 LAB — TESTOSTERONE: Testosterone: 353 ng/dL (ref 250–827)

## 2019-10-07 MED ORDER — PRAVASTATIN SODIUM 40 MG PO TABS: 40.0000 mg | ORAL_TABLET | Freq: Every day | ORAL | 3 refills | Status: DC

## 2019-10-09 ENCOUNTER — Other Ambulatory Visit: Payer: Self-pay | Admitting: Family Medicine

## 2020-01-07 ENCOUNTER — Other Ambulatory Visit: Payer: Self-pay | Admitting: Family Medicine

## 2020-01-07 NOTE — Telephone Encounter (Signed)
Last refilled: 04/10/2019 Last office visit: 10/03/2019

## 2020-05-20 DIAGNOSIS — L821 Other seborrheic keratosis: Secondary | ICD-10-CM | POA: Diagnosis not present

## 2020-05-20 DIAGNOSIS — Z85828 Personal history of other malignant neoplasm of skin: Secondary | ICD-10-CM | POA: Diagnosis not present

## 2020-05-20 DIAGNOSIS — D485 Neoplasm of uncertain behavior of skin: Secondary | ICD-10-CM | POA: Diagnosis not present

## 2020-05-20 DIAGNOSIS — C44319 Basal cell carcinoma of skin of other parts of face: Secondary | ICD-10-CM | POA: Diagnosis not present

## 2020-07-08 ENCOUNTER — Other Ambulatory Visit: Payer: Self-pay

## 2020-07-08 MED ORDER — LOSARTAN POTASSIUM-HCTZ 100-12.5 MG PO TABS: 1.0000 | ORAL_TABLET | Freq: Every day | ORAL | 2 refills | Status: DC

## 2020-07-08 MED ORDER — GABAPENTIN 300 MG PO CAPS: 300.0000 mg | ORAL_CAPSULE | Freq: Every day | ORAL | 1 refills | Status: DC

## 2020-09-14 ENCOUNTER — Ambulatory Visit (INDEPENDENT_AMBULATORY_CARE_PROVIDER_SITE_OTHER): Payer: Medicare Other | Admitting: Family Medicine

## 2020-09-14 ENCOUNTER — Other Ambulatory Visit: Payer: Self-pay

## 2020-09-14 ENCOUNTER — Encounter: Payer: Self-pay | Admitting: Family Medicine

## 2020-09-14 VITALS — BP 126/76 | HR 89 | Temp 97.9°F | Ht 70.0 in | Wt 199.0 lb

## 2020-09-14 DIAGNOSIS — R059 Cough, unspecified: Secondary | ICD-10-CM | POA: Diagnosis not present

## 2020-09-14 NOTE — Progress Notes (Signed)
Subjective:    Patient ID: Joseph Tucker, male    DOB: 04/30/1956, 65 y.o.   MRN: [id number]  HPI Patient states that he has been sick since January 1.  He reports a cough.  Cough is nonproductive.  He denies any pleurisy.  He denies any hemoptysis.  He denies any chest pain.  He denies any purulent sputum.  He denies any fevers or chills.  He had an upper respiratory infection around [place] however the cough has persisted ever since.  He does report some dyspnea on exertion.  He has not had his COVID vaccine.  However to his knowledge she has not had COVID.  However numerous people that were around him at [facility] were all sick with a similar infection.  He denies any runny nose.  He denies any sore throat.  He denies any sinus pain Past Medical History:  Diagnosis Date  . Colon polyps   . GERD (gastroesophageal reflux disease)   . Hyperlipidemia   . Hypertension   . Kidney stones   . Pneumonia   . Prediabetes    Current Outpatient Medications on File Prior to Visit  Medication Sig Dispense Refill  . aspirin 81 MG tablet Take 81 mg by mouth daily.      . cyclobenzaprine (FLEXERIL) 10 MG tablet TAKE ONE TABLET BY MOUTH THREE TIMES A DAY AS NEEDED FOR MUSCLE SPASMS 30 tablet 0  . Fexofenadine HCl (ALLEGRA PO) Take by mouth 1 dose over 46 hours.      . fish oil-omega-3 fatty acids 1000 MG capsule Take 2 g by mouth daily.    Marland Kitchen gabapentin (NEURONTIN) 300 MG capsule Take 1 capsule (300 mg total) by mouth at bedtime. 90 capsule 1  . losartan-hydrochlorothiazide (HYZAAR) 100-12.5 MG tablet Take 1 tablet by mouth daily. 90 tablet 2  . pravastatin (PRAVACHOL) 40 MG tablet Take 1 tablet (40 mg total) by mouth daily. 90 tablet 3  . vitamin B-12 (CYANOCOBALAMIN) 1000 MCG tablet Take 1,000 mcg by mouth daily.     No current facility-administered medications on file prior to visit.   Past Surgical History:  Procedure Laterality Date  . ARTHROSCOPIC REPAIR ACL    . CERVICAL SPINE SURGERY     . HERNIA REPAIR  1979  . KNEE SURGERY     RIGHT KNEE  . LITHOTRIPSY  08 & 09  . NECK SURGERY     Allergies  Allergen Reactions  . Codeine Other (See Comments)    Nausea and passing out  . Penicillins    Social History   Socioeconomic History  . Marital status: Married    Spouse name: Not on file  . Number of children: Not on file  . Years of education: Not on file  . Highest education level: Not on file  Occupational History  . Occupation: lineman  Tobacco Use  . Smoking status: Never Smoker  . Smokeless tobacco: Never Used  Substance and Sexual Activity  . Alcohol use: No  . Drug use: No  . Sexual activity: Yes    Comment: married, owns an Hydrologist  Other Topics Concern  . Not on file  Social History Narrative  . Not on file   Social Determinants of Health   Financial Resource Strain: Not on file  Food Insecurity: Not on file  Transportation Needs: Not on file  Physical Activity: Not on file  Stress: Not on file  Social Connections: Not on file  Intimate Partner Violence: Not  on file     Review of Systems  All other systems reviewed and are negative.      Objective:   Physical Exam Vitals reviewed.  Constitutional:      Appearance: He is well-developed.  HENT:     Right Ear: Tympanic membrane and ear canal normal.     Left Ear: Tympanic membrane and ear canal normal.     Nose: Congestion present.     Mouth/Throat:     Pharynx: No oropharyngeal exudate or posterior oropharyngeal erythema.  Eyes:     Conjunctiva/sclera: Conjunctivae normal.  Neck:     Thyroid: No thyromegaly.     Vascular: No JVD.  Cardiovascular:     Rate and Rhythm: Normal rate and regular rhythm.     Heart sounds: Normal heart sounds. No murmur heard.   Pulmonary:     Effort: Pulmonary effort is normal. No respiratory distress.     Breath sounds: Normal breath sounds. No wheezing or rales.  Chest:     Chest wall: No tenderness.  Abdominal:      General: Bowel sounds are normal. There is no distension.     Palpations: Abdomen is soft.     Tenderness: There is no abdominal tenderness.  Musculoskeletal:     Cervical back: Neck supple.           Assessment & Plan:  Cough - Plan: SARS-COV-2 RNA,(COVID-19) QUAL NAAT, DG Chest 2 View, CBC with Differential/Platelet, COMPLETE METABOLIC PANEL WITH GFR, D-dimer, quantitative (not at [facility]), SARS CoV2 Serology(COVID19) AB(IgG,IgM),Immunoassay  Patient's exam today is unremarkable.  I believe the patient had an upper respiratory infection and has had lingering cough ever since.  I would be surprised if the patient had Covid.  Due to the fact his symptoms have been present for several weeks, I do not believe that the nasal test will be an accurate assessment of this diagnosis.  Therefore in addition to this I will also get serology to check for antibody levels as this may prove that the patient is a long-hauler.  I will also obtain a chest x-ray.  Given his shortness of breath check CBC, CMP, and a D-dimer.  If labs are unremarkable and chest x-ray is normal, we may try empirically prednisone for bronchitis

## 2020-09-15 LAB — COMPLETE METABOLIC PANEL WITH GFR
AG Ratio: 1.7 (calc) (ref 1.0–2.5)
ALT: 30 U/L (ref 9–46)
AST: 20 U/L (ref 10–35)
Albumin: 4.3 g/dL (ref 3.6–5.1)
Alkaline phosphatase (APISO): 42 U/L (ref 35–144)
BUN: 15 mg/dL (ref 7–25)
CO2: 27 mmol/L (ref 20–32)
Calcium: 9.3 mg/dL (ref 8.6–10.3)
Chloride: 104 mmol/L (ref 98–110)
Creat: 1.17 mg/dL (ref 0.70–1.25)
GFR, Est African American: 75 mL/min/{1.73_m2} (ref >=60)
GFR, Est Non African American: 65 mL/min/{1.73_m2} (ref >=60)
Globulin: 2.6 g/dL (calc) (ref 1.9–3.7)
Glucose, Bld: 171 mg/dL — ABNORMAL HIGH (ref 65–99)
Potassium: 3.9 mmol/L (ref 3.5–5.3)
Sodium: 141 mmol/L (ref 135–146)
Total Bilirubin: 0.6 mg/dL (ref 0.2–1.2)
Total Protein: 6.9 g/dL (ref 6.1–8.1)

## 2020-09-15 LAB — D-DIMER, QUANTITATIVE: D-Dimer, Quant: 0.23 mcg/mL FEU (ref <0.50)

## 2020-09-15 LAB — CBC WITH DIFFERENTIAL/PLATELET
Absolute Monocytes: 432 cells/uL (ref 200–950)
Basophils Absolute: 9 cells/uL (ref 0–200)
Basophils Relative: 0.2 %
Eosinophils Absolute: 42 cells/uL (ref 15–500)
Eosinophils Relative: 0.9 %
HCT: 42.2 % (ref 38.5–50.0)
Hemoglobin: 14 g/dL (ref 13.2–17.1)
Lymphs Abs: 1142 cells/uL (ref 850–3900)
MCH: 29.2 pg (ref 27.0–33.0)
MCHC: 33.2 g/dL (ref 32.0–36.0)
MCV: 88.1 fL (ref 80.0–100.0)
MPV: 10.3 fL (ref 7.5–12.5)
Monocytes Relative: 9.2 %
Neutro Abs: 3074 cells/uL (ref 1500–7800)
Neutrophils Relative %: 65.4 %
Platelets: 226 10*3/uL (ref 140–400)
RBC: 4.79 10*6/uL (ref 4.20–5.80)
RDW: 13 % (ref 11.0–15.0)
Total Lymphocyte: 24.3 %
WBC: 4.7 10*3/uL (ref 3.8–10.8)

## 2020-09-15 LAB — SARS-COV-2 RNA,(COVID-19) QUALITATIVE NAAT: SARS CoV2 RNA: DETECTED — CR

## 2020-09-15 LAB — SARS COV-2 SEROLOGY(COVID-19)AB(IGG,IGM),IMMUNOASSAY
SARS CoV-2 AB IgG: NEGATIVE
SARS CoV-2 IgM: NEGATIVE

## 2020-09-16 ENCOUNTER — Other Ambulatory Visit: Payer: Self-pay | Admitting: *Deleted

## 2020-09-16 ENCOUNTER — Encounter: Payer: Self-pay | Admitting: Family Medicine

## 2020-09-16 ENCOUNTER — Telehealth: Payer: Self-pay | Admitting: *Deleted

## 2020-09-16 NOTE — Telephone Encounter (Signed)
Received call from patient wife.   States that patient continues to have cough and is now voicing C/O SOB. States that he feels he cannot take a deep breath, and is having difficulty catching his breath after coughing fit.   Advised to take patient to ER for SOB/ COVID +. Verbalized understanding.

## 2020-09-18 ENCOUNTER — Other Ambulatory Visit: Payer: Self-pay | Admitting: Family Medicine

## 2020-09-18 MED ORDER — PROMETHAZINE-DM 6.25-15 MG/5ML PO SYRP: 5.0000 mL | ORAL_SOLUTION | Freq: Four times a day (QID) | ORAL | 0 refills | Status: DC | PRN

## 2020-10-05 ENCOUNTER — Telehealth: Payer: Self-pay | Admitting: Family Medicine

## 2020-10-05 MED ORDER — PRAVASTATIN SODIUM 40 MG PO TABS: 40.0000 mg | ORAL_TABLET | Freq: Every day | ORAL | 3 refills | Status: DC

## 2020-10-05 NOTE — Telephone Encounter (Signed)
Received request from Joseph Tucker requesting a 90 day supply on his pravastatin (PRAVACHOL) 40 MG tablet

## 2020-10-05 NOTE — Telephone Encounter (Signed)
Prescription sent to pharmacy.

## 2020-10-08 ENCOUNTER — Ambulatory Visit (INDEPENDENT_AMBULATORY_CARE_PROVIDER_SITE_OTHER): Payer: Medicare Other | Admitting: Family Medicine

## 2020-10-08 ENCOUNTER — Other Ambulatory Visit: Payer: Self-pay

## 2020-10-08 DIAGNOSIS — E7800 Pure hypercholesterolemia, unspecified: Secondary | ICD-10-CM

## 2020-10-08 DIAGNOSIS — Z1211 Encounter for screening for malignant neoplasm of colon: Secondary | ICD-10-CM | POA: Diagnosis not present

## 2020-10-08 DIAGNOSIS — Z125 Encounter for screening for malignant neoplasm of prostate: Secondary | ICD-10-CM | POA: Diagnosis not present

## 2020-10-08 DIAGNOSIS — R7303 Prediabetes: Secondary | ICD-10-CM

## 2020-10-08 DIAGNOSIS — Z0001 Encounter for general adult medical examination with abnormal findings: Secondary | ICD-10-CM

## 2020-10-08 DIAGNOSIS — Z0000 Encounter for general adult medical examination without abnormal findings: Secondary | ICD-10-CM

## 2020-10-08 NOTE — Progress Notes (Signed)
Subjective:    Patient ID: Joseph Tucker, male    DOB: 05/06/1956, 65 y.o.   MRN: [id number]  HPI  Patient is a very pleasant 65 year old Caucasian male here today for complete physical exam.  His last colonoscopy was in 2012.  He is due.  He used to see Dr. Sharlett Iles so he would have to establish with one of his partners.  He is also due for prostate cancer screening.  He is due for Pneumovax 23.  He has had Zostavax however he is due for Shingrix.  He declines the Covid shot having recently had COVID.  He is totally recovered.  He denies any additional cough or shortness of breath or chest pain.  Overall he is doing well with no concerns although he did recently injure his right wrist trying to shut a heavy tailgate on his work truck.  He has sharp pain in the wrist on the dorsal part of the wrist with dorsiflexion.  He also has some burning and stinging over the carpal tunnel.  He has no pain with range of motion in his fingers and has normal grip strength. No visits with results within 1 Day(s) from this visit.  Latest known visit with results is:  Office Visit on 09/14/2020  Component Date Value Ref Range Status  . SARS CoV2 RNA 09/14/2020 Detected* Not Detect Final   Comment: . A Detected result is considered a positive test result for COVID-19. This indicates that RNA from SARS-CoV-2 (formerly 2019-nCoV) was detected, and the patient is infected with the virus and presumed to be contagious. If requested by public health authority, specimen will be sent for additional testing. CRITICAL VALUE REPORT . Please review the "Fact Sheets" and FDA authorized labeling available for health care providers and patients using the following websites: https://www.[facility].com/home/Covid-19/HCP/ [facility]/fact-sheet https://www.[facility].com/home/Covid-19/ Patients/[facility]/fact-sheet.html . This test has been authorized by the FDA under an Emergency Use Authorization (EUA) for  use by authorized laboratories. . Due to the current public health emergency, [facility] is receiving a high volume of samples from a wide variety of swabs and media for COVID-19 testing. In order to serve patients during this public health crisis, sampl                          es from appropriate clinical sources are being tested. Negative test results derived from specimens received in non-commercially manufactured viral collection and transport media, or in media and sample collection kits not yet authorized by FDA for COVID-19 testing should be cautiously evaluated and the patient potentially subjected to extra precautions such as additional clinical monitoring, including collection of an additional specimen. . Methodology:  Nucleic Acid Amplification Test (NAAT) includes RT-PCR or TMA . Additional information about COVID-19 can be found at the [facility] website: www.[facility].com/Covid19   . WBC 09/14/2020 4.7  3.8 - 10.8 Thousand/uL Final  . RBC 09/14/2020 4.79  4.20 - 5.80 Million/uL Final  . Hemoglobin 09/14/2020 14.0  13.2 - 17.1 g/dL Final  . HCT 09/14/2020 42.2  38.5 - 50.0 % Final  . MCV 09/14/2020 88.1  80.0 - 100.0 fL Final  . MCH 09/14/2020 29.2  27.0 - 33.0 pg Final  . MCHC 09/14/2020 33.2  32.0 - 36.0 g/dL Final  . RDW 09/14/2020 13.0  11.0 - 15.0 % Final  . Platelets 09/14/2020 226  140 - 400 Thousand/uL Final  . MPV 09/14/2020 10.3  7.5 - 12.5 fL Final  . Neutro  Abs 09/14/2020 3,074  1,500 - 7,800 cells/uL Final  . Lymphs Abs 09/14/2020 1,142  850 - 3,900 cells/uL Final  . Absolute Monocytes 09/14/2020 432  200 - 950 cells/uL Final  . Eosinophils Absolute 09/14/2020 42  15 - 500 cells/uL Final  . Basophils Absolute 09/14/2020 9  0 - 200 cells/uL Final  . Neutrophils Relative % 09/14/2020 65.4  % Final  . Total Lymphocyte 09/14/2020 24.3  % Final  . Monocytes Relative 09/14/2020 9.2  % Final  . Eosinophils Relative 09/14/2020 0.9  %  Final  . Basophils Relative 09/14/2020 0.2  % Final  . Glucose, Bld 09/14/2020 171* 65 - 99 mg/dL Final   Comment: .            Fasting reference interval . For someone without known diabetes, a glucose value >125 mg/dL indicates that they may have diabetes and this should be confirmed with a follow-up test. .   . BUN 09/14/2020 15  7 - 25 mg/dL Final  . Creat 09/14/2020 1.17  0.70 - 1.25 mg/dL Final   Comment: For patients >41 years of age, the reference limit for Creatinine is approximately 13% higher for people identified as African-American. .   . GFR, Est Non African American 09/14/2020 65  > OR = 60 mL/min/1.24m2 Final  . GFR, Est African American 09/14/2020 75  > OR = 60 mL/min/1.23m2 Final  . BUN/Creatinine Ratio 59/56/3875 NOT APPLICABLE  6 - 22 (calc) Final  . Sodium 09/14/2020 141  135 - 146 mmol/L Final  . Potassium 09/14/2020 3.9  3.5 - 5.3 mmol/L Final  . Chloride 09/14/2020 104  98 - 110 mmol/L Final  . CO2 09/14/2020 27  20 - 32 mmol/L Final  . Calcium 09/14/2020 9.3  8.6 - 10.3 mg/dL Final  . Total Protein 09/14/2020 6.9  6.1 - 8.1 g/dL Final  . Albumin 09/14/2020 4.3  3.6 - 5.1 g/dL Final  . Globulin 09/14/2020 2.6  1.9 - 3.7 g/dL (calc) Final  . AG Ratio 09/14/2020 1.7  1.0 - 2.5 (calc) Final  . Total Bilirubin 09/14/2020 0.6  0.2 - 1.2 mg/dL Final  . Alkaline phosphatase (APISO) 09/14/2020 42  35 - 144 U/L Final  . AST 09/14/2020 20  10 - 35 U/L Final  . ALT 09/14/2020 30  9 - 46 U/L Final  . D-Dimer, Quant 09/14/2020 0.23  <0.50 mcg/mL FEU Final   Comment: . The D-Dimer test is used frequently to exclude an acute PE or DVT. In patients with a low to moderate clinical risk assessment and a D-Dimer result <0.50 mcg/mL FEU, the likelihood of a PE or DVT is very low. However, a thromboembolic event should not be excluded solely on the basis of the D-Dimer level. Increased levels of D-Dimer are associated with a PE, DVT, DIC, malignancies, inflammation,  sepsis, surgery, trauma, pregnancy, and advancing patient age. [Jama 2006 11:295(2):199-207] . For additional information, please refer to: http://education.[facility].com/faq/FAQ149 (This link is being provided for informational/ educational purposes only) .   Marland Kitchen SARS CoV-2 AB IgG 09/14/2020 NEGATIVE  NEGATIVE Final  . SARS CoV-2 IgM 09/14/2020 NEGATIVE  NEGATIVE Final   Comment: . Reference range: Negative . These tests are intended for use as an aid in identifying individuals with an adaptive immune response to SARS-CoV-2,  indicating recent or prior infection. Results are for the detection of SARS-CoV-2 IgG and IgM antibodies.  IgM antibodies to SARS-CoV-2 are generally detectable in blood several days after initial infection, with IgG antibodies  typically reaching detectable levels a few days later.  The duration of time antibodies are present post-infection is not well characterized. At this time, it is unknown how long IgG and IgM antibodies persist following infection, or if the presence of antibodies confers protective immunity. Individuals may have detectable virus present for several weeks following seroconversion.  Negative results for antibodies do not preclude acute SARS-CoV-2 infection.  These tests should not be used to diagnose acute SARS-CoV-2 infection.  If acute infection is suspected, direct testing for SARS-CoV-2 is nece                          ssary. False positive results for the tests may occur due to cross-reactivity from pre-existing antibodies or other possible causes. The sensitivity of the tests early after infection is unknown. . IgM Result    IgG Result    Interpretation Negative      Negative      Antibodies not detected.                             Does not preclude acute                              SARS-CoV-2 infection. Negative      Positive      Suggests past exposure                             to SARS-CoV-2. Positive       Negative      Suggests recent exposure                             to SARS-CoV-2. Positive      Positive      Suggests recent exposure                             to SARS-CoV-2. Marland Kitchen Please review the "Fact Sheets" available for health care providers and patients using the following  websites:   [facility]/home/Covid-19/HCP/antibody/fact-sheet2  [facility]/home/Covid-19/Patients/antibody/fact-sheet2 [facility]/home/Covid-19/HCP/antibody/fact-s                          heet6  [facility]/home/Covid-19/Patients/antibody/fact-sheet6 . These tests have been authorized by the FDA under an Emergency Use Authorization (EUA) for use by authorized laboratories. The FDA authorized fact sheets are available on the [facility] website:  www.[facility]/Covid19.   . For additional information please refer to  http://education.[facility].com/faq/FAQ219 (This link is being provided for informational/ educational purposes only.)    . Reference range: Negative . These tests are intended for use as an aid in identifying individuals with an adaptive immune response to SARS-CoV-2,  indicating recent or prior infection. Results are for the detection of SARS-CoV-2 IgG and IgM antibodies.  IgM antibodies to SARS-CoV-2 are generally detectable in blood several days after initial infection, with IgG antibodies typically reaching detectable levels a few days later.  The duration of time antibodies are present post-infecti                          on is not well characterized. At this time, it is unknown how long IgG and IgM antibodies persist  following infection, or if the presence of antibodies confers protective immunity. Individuals may have detectable virus present for several weeks following seroconversion.  Negative results for antibodies do not preclude acute SARS-CoV-2 infection.  These tests should not be used to diagnose acute  SARS-CoV-2 infection.  If acute infection is suspected, direct testing for SARS-CoV-2 is necessary. False positive results for the tests may occur due to cross-reactivity from pre-existing antibodies or other possible causes. The sensitivity of the tests early after infection is unknown. . IgM Result    IgG Result    Interpretation Negative      Negative      Antibodies not detected.                             Does not preclude acute                              SARS-CoV-2 infection. Negative      Positive      Suggests past exposure                             to SARS-CoV-2.                           Positive      Negative      Suggests recent exposure                             to SARS-CoV-2. Positive      Positive      Suggests recent exposure                             to SARS-CoV-2. Marland Kitchen Please review the "Fact Sheets" available for health care providers and patients using the following  websites:   [facility]/home/Covid-19/HCP/antibody/fact-sheet2  [facility]/home/Covid-19/Patients/antibody/fact-sheet2 [facility]/home/Covid-19/HCP/antibody/fact-sheet6  [facility]/home/Covid-19/Patients/antibody/fact-sheet6 . These tests have been authorized by the FDA under an Emergency Use Authorization (EUA) for use by authorized laboratories. The FDA authorized fact sheets are available on the [employer] [facility] website:  www.[facility]/Covid19.   . For additional information please refer to  http://education.[facility].com/faq/FAQ219 (This link is being provided for informational/ educational purposes only.)       Patient has a history of prostatitis Past Medical History:  Diagnosis Date  . Colon polyps   . GERD (gastroesophageal reflux disease)   . Hyperlipidemia   . Hypertension   . Kidney stones   . Pneumonia   . Prediabetes    Current Outpatient Medications on File Prior to Visit  Medication Sig Dispense Refill  .  aspirin 81 MG tablet Take 81 mg by mouth daily.    . cyclobenzaprine (FLEXERIL) 10 MG tablet TAKE ONE TABLET BY MOUTH THREE TIMES A DAY AS NEEDED FOR MUSCLE SPASMS 30 tablet 0  . Fexofenadine HCl (ALLEGRA PO) Take by mouth 1 day or 1 dose.    . fish oil-omega-3 fatty acids 1000 MG capsule Take 2 g by mouth daily.    Marland Kitchen gabapentin (NEURONTIN) 300 MG capsule Take 1 capsule (300 mg total) by mouth at bedtime. 90 capsule 1  . losartan-hydrochlorothiazide (HYZAAR) 100-12.5 MG tablet Take 1 tablet by mouth daily. 90 tablet 2  . pravastatin (PRAVACHOL) 40 MG tablet Take  1 tablet (40 mg total) by mouth daily. 90 tablet 3  . promethazine-dextromethorphan (PROMETHAZINE-DM) 6.25-15 MG/5ML syrup Take 5 mLs by mouth 4 (four) times daily as needed for cough. 118 mL 0  . vitamin B-12 (CYANOCOBALAMIN) 1000 MCG tablet Take 1,000 mcg by mouth daily.     No current facility-administered medications on file prior to visit.   Past Surgical History:  Procedure Laterality Date  . ARTHROSCOPIC REPAIR ACL    . CERVICAL SPINE SURGERY    . HERNIA REPAIR  1979  . KNEE SURGERY     RIGHT KNEE  . LITHOTRIPSY  08 & 09  . NECK SURGERY     Allergies  Allergen Reactions  . Codeine Other (See Comments)    Nausea and passing out  . Penicillins    Social History   Socioeconomic History  . Marital status: Married    Spouse name: Not on file  . Number of children: Not on file  . Years of education: Not on file  . Highest education level: Not on file  Occupational History  . Occupation: lineman  Tobacco Use  . Smoking status: Never Smoker  . Smokeless tobacco: Never Used  Substance and Sexual Activity  . Alcohol use: No  . Drug use: No  . Sexual activity: Yes    Comment: married, owns an [employer]  Other Topics Concern  . Not on file  Social History Narrative  . Not on file   Social Determinants of Health   Financial Resource Strain: Not on file  Food Insecurity: Not on file   Transportation Needs: Not on file  Physical Activity: Not on file  Stress: Not on file  Social Connections: Not on file  Intimate Partner Violence: Not on file   Family History  Problem Relation Age of Onset  . Colon cancer Father   . Colon polyps Father   . Heart disease Father   . Cancer Father        colon  . Stomach cancer Mother   . Cancer Mother        brain  . Leukemia Sister   . Cancer Sister        leukemia     Review of Systems  All other systems reviewed and are negative.      Objective:   Physical Exam Vitals reviewed.  Constitutional:      General: He is not in acute distress.    Appearance: He is well-developed. He is not diaphoretic.  HENT:     Head: Normocephalic and atraumatic.     Right Ear: External ear normal.     Left Ear: External ear normal.     Nose: Nose normal.     Mouth/Throat:     Pharynx: No oropharyngeal exudate.  Eyes:     General: No scleral icterus.       Right eye: No discharge.        Left eye: No discharge.     Conjunctiva/sclera: Conjunctivae normal.     Pupils: Pupils are equal, round, and reactive to light.  Neck:     Thyroid: No thyromegaly.     Vascular: No JVD.     Trachea: No tracheal deviation.  Cardiovascular:     Rate and Rhythm: Normal rate and regular rhythm.     Heart sounds: Normal heart sounds. No murmur heard. No friction rub. No gallop.   Pulmonary:     Effort: Pulmonary effort is normal. No respiratory distress.  Breath sounds: Normal breath sounds. No stridor. No wheezing or rales.  Chest:     Chest wall: No tenderness.  Abdominal:     General: Bowel sounds are normal. There is no distension.     Palpations: Abdomen is soft. There is no mass.     Tenderness: There is no abdominal tenderness. There is no guarding or rebound.  Musculoskeletal:        General: No tenderness or deformity.     Cervical back: Normal range of motion and neck supple.  Lymphadenopathy:     Cervical: No cervical  adenopathy.  Skin:    General: Skin is warm.     Coloration: Skin is not pale.     Findings: No erythema or rash.  Neurological:     Mental Status: He is alert and oriented to person, place, and time.     Cranial Nerves: No cranial nerve deficit.     Motor: No abnormal muscle tone.     Coordination: Coordination normal.     Deep Tendon Reflexes: Reflexes are normal and symmetric.  Psychiatric:        Behavior: Behavior normal.        Thought Content: Thought content normal.        Judgment: Judgment normal.           Assessment & Plan:  Colon cancer screening - Plan: Ambulatory referral to Gastroenterology  Prediabetes - Plan: COMPLETE METABOLIC PANEL WITH GFR, Lipid panel, Microalbumin, urine, Hemoglobin A1c  Prostate cancer screening - Plan: PSA  General medical exam  Pure hypercholesterolemia  Physical exam blood pressure is excellent at 133/70.  Weight is 191 pounds.  72 bpm on my exam with respiratory rate of 20 and a normal pulse oximetry.  Patient received Pneumovax 23.  Recommended Shingrix.  Declined COVID shot and the flu shot.  Schedule colonoscopy.  Check CBC, CMP, lipid panel, A1c, urine microalbumin and PSA for prostate cancer screening.  Believe he sprained his wrist.  Recommended a splint for comfort and then follow-up if pain is not improving in 2 weeks

## 2020-10-09 ENCOUNTER — Encounter: Payer: Self-pay | Admitting: *Deleted

## 2020-10-09 LAB — COMPLETE METABOLIC PANEL WITH GFR
AG Ratio: 1.8 (calc) (ref 1.0–2.5)
ALT: 31 U/L (ref 9–46)
AST: 19 U/L (ref 10–35)
Albumin: 4.6 g/dL (ref 3.6–5.1)
Alkaline phosphatase (APISO): 42 U/L (ref 35–144)
BUN: 20 mg/dL (ref 7–25)
CO2: 27 mmol/L (ref 20–32)
Calcium: 9.5 mg/dL (ref 8.6–10.3)
Chloride: 106 mmol/L (ref 98–110)
Creat: 1.16 mg/dL (ref 0.70–1.25)
GFR, Est African American: 76 mL/min/{1.73_m2} (ref >=60)
GFR, Est Non African American: 66 mL/min/{1.73_m2} (ref >=60)
Globulin: 2.5 g/dL (calc) (ref 1.9–3.7)
Glucose, Bld: 124 mg/dL — ABNORMAL HIGH (ref 65–99)
Potassium: 4.3 mmol/L (ref 3.5–5.3)
Sodium: 141 mmol/L (ref 135–146)
Total Bilirubin: 0.6 mg/dL (ref 0.2–1.2)
Total Protein: 7.1 g/dL (ref 6.1–8.1)

## 2020-10-09 LAB — LIPID PANEL
Cholesterol: 149 mg/dL (ref <200)
HDL: 47 mg/dL (ref >=40)
LDL Cholesterol (Calc): 81 mg/dL (calc)
Non-HDL Cholesterol (Calc): 102 mg/dL (calc) (ref <130)
Total CHOL/HDL Ratio: 3.2 (calc) (ref <5.0)
Triglycerides: 111 mg/dL (ref <150)

## 2020-10-09 LAB — MICROALBUMIN, URINE: Microalb, Ur: 0.4 mg/dL

## 2020-10-09 LAB — HEMOGLOBIN A1C
Hgb A1c MFr Bld: 6.6 % of total Hgb — ABNORMAL HIGH (ref <5.7)
Mean Plasma Glucose: 143 mg/dL
eAG (mmol/L): 7.9 mmol/L

## 2020-10-09 LAB — PSA: PSA: 1.8 ng/mL (ref <=4.0)

## 2020-10-20 DIAGNOSIS — H524 Presbyopia: Secondary | ICD-10-CM | POA: Diagnosis not present

## 2020-10-20 LAB — HM DIABETES EYE EXAM

## 2020-11-23 ENCOUNTER — Encounter: Payer: Self-pay | Admitting: Family Medicine

## 2020-11-23 ENCOUNTER — Other Ambulatory Visit: Payer: Self-pay | Admitting: Family Medicine

## 2020-11-23 MED ORDER — PREDNISONE 20 MG PO TABS: ORAL_TABLET | ORAL | 0 refills | Status: DC

## 2020-12-07 ENCOUNTER — Other Ambulatory Visit: Payer: Self-pay | Admitting: Family Medicine

## 2020-12-07 MED ORDER — COLCHICINE 0.6 MG PO TABS: 0.6000 mg | ORAL_TABLET | Freq: Every day | ORAL | 0 refills | Status: DC

## 2020-12-22 ENCOUNTER — Telehealth: Payer: Self-pay | Admitting: Gastroenterology

## 2020-12-22 NOTE — Telephone Encounter (Signed)
Pt's wife called stated that she received a call from our office offering to add an EGD to procedure. Pt is sch'd for colonoscopy only on 01/25/21. I have reviewed his chart and I haven't seen any documentation regarding an EGD. Please give pt's wife a call. Thank you

## 2020-12-22 NOTE — Telephone Encounter (Signed)
Spoke with patient's wife in regards to recommendations. She states that patient previously saw Dr. Sharlett Iles and patient was diagnosed with Barrett's esophagus and GERD. She thought that he was recommended to have a repeat upper endoscopy at the time of his colonoscopy. Wife would like to see if you can confirm this information based on last pathology report from 11/2010 - records are in epic. Advised again that Dr. Loletha Carrow may recommend that patient be seen in the office prior to scheduling EGD as he has not been seen in several years. Please advise, thanks.

## 2020-12-22 NOTE — Telephone Encounter (Signed)
Thank you for the information.  I reviewed the endoscopy and pathology report from Dr. Veleta Miners 2012 upper endoscopy. He made the diagnosis of Barrett's esophagus, and reminder letters for upper endoscopy were sent in April 2015 in September 2015.  After looking at the endoscopy report, it is difficult to tell for certain if this diagnosis of Barrett's is accurate, since it depends on location and technique of obtaining the biopsies.  Nevertheless, we should assume for now that the diagnosis is accurate, in which case Joseph Tucker should have an upper endoscopy for reevaluation.  As it stands now, I could do the upper endoscopy on June 20 when he is scheduled for colonoscopy.  In order to do so, the 10 AM slot would need to be blocked with a note indicating that this patient's 830 case is an EGD/colonoscopy.  Thanks  - HD

## 2020-12-22 NOTE — Telephone Encounter (Signed)
Spoke with patient's wife, she states that a voicemail was left on patient's voicemail offering to add an EGD to his scheduled colonoscopy in June and patient was to call us back if he wanted to proceed. His wife states that he would like to have EGD at the time of colonoscopy. The caller did not leave their name. I do not see any documentation of a being placed to the patient. Please advise, thanks.

## 2020-12-22 NOTE — Telephone Encounter (Signed)
I do not know any more about that.  He was referred to [place] by his primary care provider for colon cancer screening, and there is no documentation of our office having contacted the patient to offer an upper endoscopy.  If he has upper digestive symptoms he would like evaluated, then he should cancel the colonoscopy and make an office appointment to see me.  - HD

## 2020-12-23 DIAGNOSIS — M25531 Pain in right wrist: Secondary | ICD-10-CM | POA: Diagnosis not present

## 2020-12-23 NOTE — Telephone Encounter (Signed)
Appt has been changed to an endo/colon.  Lm on vm for patient's wife to return call.

## 2020-12-24 NOTE — Telephone Encounter (Signed)
Spoke with patient's wife and advised that upper endoscopy has been added to currently scheduled colonoscopy. Wife wanted to confirm that patient' pre-visit would be virtual. Answered all of wife's questions, she verbalized understanding and had no concerns at the end of the call.

## 2021-01-11 ENCOUNTER — Ambulatory Visit (AMBULATORY_SURGERY_CENTER): Payer: Medicare Other | Admitting: *Deleted

## 2021-01-11 ENCOUNTER — Other Ambulatory Visit: Payer: Self-pay

## 2021-01-11 VITALS — Ht 70.0 in | Wt 199.0 lb

## 2021-01-11 DIAGNOSIS — K2270 Barrett's esophagus without dysplasia: Secondary | ICD-10-CM

## 2021-01-11 DIAGNOSIS — Z1211 Encounter for screening for malignant neoplasm of colon: Secondary | ICD-10-CM

## 2021-01-11 MED ORDER — PEG-KCL-NACL-NASULF-NA ASC-C 100 G PO SOLR: 1.0000 | Freq: Once | ORAL | 0 refills | Status: AC

## 2021-01-11 NOTE — Progress Notes (Signed)
Patient's pre-visit was done today over the phone with the patient due to COVID-19 pandemic. Name,DOB and address verified. Insurance verified. Patient denies any allergies to Eggs and Soy. Patient denies any problems with anesthesia/sedation. Patient denies taking diet pills or blood thinners. Packet of Prep instructions mailed to patient including a copy of a consent form-pt is aware. Patient understands to call us back with any questions or concerns. Patient is aware of our care-partner policy and [id number] safety protocol. EMMI education assigned to the patient for the procedure, sent to [place].

## 2021-01-15 ENCOUNTER — Other Ambulatory Visit: Payer: Self-pay

## 2021-01-15 MED ORDER — COLCHICINE 0.6 MG PO TABS: 0.6000 mg | ORAL_TABLET | Freq: Every day | ORAL | 0 refills | Status: DC

## 2021-01-21 ENCOUNTER — Telehealth: Payer: Self-pay | Admitting: Gastroenterology

## 2021-01-21 NOTE — Telephone Encounter (Signed)
Called and spoke to patient's wife, Langley Gauss, she reports the patient is wanting to take the medication the night before the procedure- advised Langley Gauss the patient can take the medication the night before the procedure as he is prescribed and can also take the AM of the procedure as long as he does not take after the STOP time from anesthesia - Langley Gauss verbalized understanding of information and reports she will call back to the office should she have any further questions//concerns

## 2021-01-21 NOTE — Telephone Encounter (Signed)
Inbound from patient wife, Langley Gauss. States patient have recently stated taking colchicine and prenisone and wants to know if that is okay to take ahead of his procedure EDG/COLON on Monday 01/25/21. Best contact number [phone]

## 2021-01-24 ENCOUNTER — Encounter: Payer: Self-pay | Admitting: Certified Registered Nurse Anesthetist

## 2021-01-25 ENCOUNTER — Encounter: Payer: Self-pay | Admitting: Family Medicine

## 2021-01-25 ENCOUNTER — Other Ambulatory Visit: Payer: Self-pay

## 2021-01-25 ENCOUNTER — Ambulatory Visit (AMBULATORY_SURGERY_CENTER): Payer: Medicare Other | Admitting: Gastroenterology

## 2021-01-25 ENCOUNTER — Encounter: Payer: Self-pay | Admitting: Gastroenterology

## 2021-01-25 VITALS — BP 134/83 | HR 51 | Temp 97.8°F | Resp 13 | Ht 70.0 in | Wt 199.0 lb

## 2021-01-25 DIAGNOSIS — I10 Essential (primary) hypertension: Secondary | ICD-10-CM | POA: Diagnosis not present

## 2021-01-25 DIAGNOSIS — Z86010 Personal history of colonic polyps: Secondary | ICD-10-CM

## 2021-01-25 DIAGNOSIS — Z1211 Encounter for screening for malignant neoplasm of colon: Secondary | ICD-10-CM

## 2021-01-25 DIAGNOSIS — E7800 Pure hypercholesterolemia, unspecified: Secondary | ICD-10-CM | POA: Diagnosis not present

## 2021-01-25 DIAGNOSIS — D122 Benign neoplasm of ascending colon: Secondary | ICD-10-CM

## 2021-01-25 DIAGNOSIS — K219 Gastro-esophageal reflux disease without esophagitis: Secondary | ICD-10-CM | POA: Diagnosis not present

## 2021-01-25 DIAGNOSIS — K2270 Barrett's esophagus without dysplasia: Secondary | ICD-10-CM

## 2021-01-25 DIAGNOSIS — K317 Polyp of stomach and duodenum: Secondary | ICD-10-CM

## 2021-01-25 DIAGNOSIS — Z800 Family history of malignant neoplasm of digestive organs: Secondary | ICD-10-CM

## 2021-01-25 DIAGNOSIS — Z8719 Personal history of other diseases of the digestive system: Secondary | ICD-10-CM | POA: Diagnosis not present

## 2021-01-25 DIAGNOSIS — D120 Benign neoplasm of cecum: Secondary | ICD-10-CM

## 2021-01-25 MED ORDER — SODIUM CHLORIDE 0.9 % IV SOLN
500.0000 mL | INTRAVENOUS | Status: DC
Start: 2021-01-25 — End: 2021-01-25

## 2021-01-25 NOTE — Progress Notes (Signed)
Pt's states no medical or surgical changes since previsit or office visit. 

## 2021-01-25 NOTE — Progress Notes (Signed)
VS taken by [place] 

## 2021-01-25 NOTE — Op Note (Signed)
[place] Patient Name: Joseph Tucker Procedure Date: 01/25/2021 8:40 AM MRN: [id number] Endoscopist: Mallie Mussel L. Loletha Carrow , MD Age: 65 Referring MD:  Date of Birth: 1955-12-16 Gender: Male Account #: [id number] Procedure:                Upper GI endoscopy Indications:              Esophageal reflux, Surveillance for malignancy due                            to personal history of Barrett's esophagus (2012) Medicines:                Monitored Anesthesia Care Procedure:                Pre-Anesthesia Assessment:                           - Prior to the procedure, a History and Physical                            was performed, and patient medications and                            allergies were reviewed. The patient's tolerance of                            previous anesthesia was also reviewed. The risks                            and benefits of the procedure and the sedation                            options and risks were discussed with the patient.                            All questions were answered, and informed consent                            was obtained. Prior Anticoagulants: The patient has                            taken no previous anticoagulant or antiplatelet                            agents. ASA Grade Assessment: II - A patient with                            mild systemic disease. After reviewing the risks                            and benefits, the patient was deemed in                            satisfactory condition to undergo the procedure.  After obtaining informed consent, the endoscope was                            passed under direct vision. Throughout the                            procedure, the patient's blood pressure, pulse, and                            oxygen saturations were monitored continuously. The                            Endoscope was introduced through the mouth, and                             advanced to the second part of duodenum. The upper                            GI endoscopy was accomplished without difficulty.                            The patient tolerated the procedure well. Scope In: Scope Out: Findings:                 There is no endoscopic evidence of Barrett's                            esophagus or esophagitis in the entire esophagus.                           A few small sessile fundic gland polyps were found                            in the stomach.                           The exam of the stomach was otherwise normal.                           The cardia and gastric fundus were normal on                            retroflexion.                           The examined duodenum was normal. Complications:            No immediate complications. Estimated Blood Loss:     Estimated blood loss: none. Impression:               - A few fundic gland polyps.                           - Normal examined duodenum.                           -  No specimens collected.                           There is no endoscopic evidence of Barrett's                            esophagus. Suspect original diagnosis inaccurate -                            biopsies likely taken from gastric cardia. Recommendation:           - Patient has a contact number available for                            emergencies. The signs and symptoms of potential                            delayed complications were discussed with the                            patient. Return to normal activities tomorrow.                            Written discharge instructions were provided to the                            patient.                           - Resume previous diet.                           - Continue present medications.                           - See the other procedure note for documentation of                            additional recommendations. Faviola Klare L. Loletha Carrow, MD 01/25/2021 9:24:39 AM This  report has been signed electronically.

## 2021-01-25 NOTE — Op Note (Signed)
[facility] Patient Name: Joseph Tucker Procedure Date: 01/25/2021 8:45 AM MRN: [id number] Endoscopist: Mallie Mussel L. Loletha Carrow , MD Age: 65 Referring MD:  Date of Birth: 1955-12-15 Gender: Male Account #: [id number] Procedure:                Colonoscopy Indications:              Colon cancer screening in patient at increased                            risk: Colorectal cancer in father                           (no polyps last colonoscopy 2012 or in 2008. TA in                            2005) Medicines:                Monitored Anesthesia Care Procedure:                Pre-Anesthesia Assessment:                           - Prior to the procedure, a History and Physical                            was performed, and patient medications and                            allergies were reviewed. The patient's tolerance of                            previous anesthesia was also reviewed. The risks                            and benefits of the procedure and the sedation                            options and risks were discussed with the patient.                            All questions were answered, and informed consent                            was obtained. Prior Anticoagulants: The patient has                            taken no previous anticoagulant or antiplatelet                            agents. ASA Grade Assessment: II - A patient with                            mild systemic disease. After reviewing the risks  and benefits, the patient was deemed in                            satisfactory condition to undergo the procedure.                           After obtaining informed consent, the colonoscope                            was passed under direct vision. Throughout the                            procedure, the patient's blood pressure, pulse, and                            oxygen saturations were monitored continuously. The                             Olympus CF-[id number]) Colonoscope was                            introduced through the anus and advanced to the the                            cecum, identified by appendiceal orifice and                            ileocecal valve. The colonoscopy was performed                            without difficulty. The patient tolerated the                            procedure well. The quality of the bowel                            preparation was good. The ileocecal valve,                            appendiceal orifice, and rectum were photographed.                            The bowel preparation used was MoviPrep. Scope In: 8:55:33 AM Scope Out: 9:08:00 AM Scope Withdrawal Time: 0 hours 9 minutes 53 seconds  Total Procedure Duration: 0 hours 12 minutes 27 seconds  Findings:                 The perianal and digital rectal examinations were                            normal.                           Two sessile polyps were found in the proximal  ascending colon and cecum. The polyps were                            diminutive in size. These polyps were removed with                            a cold snare. Resection and retrieval were complete.                           Diverticula were found in the left colon.                           The exam was otherwise without abnormality on                            direct and retroflexion views. Complications:            No immediate complications. Estimated Blood Loss:     Estimated blood loss was minimal. Impression:               - Two diminutive polyps in the proximal ascending                            colon and in the cecum, removed with a cold snare.                            Resected and retrieved.                           - Diverticulosis in the left colon.                           - The examination was otherwise normal on direct                            and retroflexion views. Recommendation:            - Patient has a contact number available for                            emergencies. The signs and symptoms of potential                            delayed complications were discussed with the                            patient. Return to normal activities tomorrow.                            Written discharge instructions were provided to the                            patient.                           - Resume previous diet.                           -  Continue present medications.                           - Await pathology results.                           - Repeat colonoscopy in 5 years for surveillance                            and family history of colon cancer.                           - See the other procedure note for documentation of                            additional recommendations. Imre Vecchione L. Loletha Carrow, MD 01/25/2021 9:20:29 AM This report has been signed electronically.

## 2021-01-25 NOTE — Patient Instructions (Addendum)
Colonoscopy: Handout on colon polyps given to you today  Await pathology results on polyps removed    Upper Endoscopy:  Normal- No biopsies done  Handout on Reflux ( measures you can take to control Reflux given to you today)- Make appointment to discuss reflux issues with Dr Loletha Carrow per Dr Loletha Carrow       YOU HAD AN ENDOSCOPIC PROCEDURE TODAY AT THE [facility]:   Refer to the procedure report that was given to you for any specific questions about what was found during the examination.  If the procedure report does not answer your questions, please call your gastroenterologist to clarify.  If you requested that your care partner not be given the details of your procedure findings, then the procedure report has been included in a sealed envelope for you to review at your convenience later.  YOU SHOULD EXPECT: Some feelings of bloating in the abdomen. Passage of more gas than usual.  Walking can help get rid of the air that was put into your GI tract during the procedure and reduce the bloating. If you had a lower endoscopy (such as a colonoscopy or flexible sigmoidoscopy) you may notice spotting of blood in your stool or on the toilet paper. If you underwent a bowel prep for your procedure, you may not have a normal bowel movement for a few days.  Please Note:  You might notice some irritation and congestion in your nose or some drainage.  This is from the oxygen used during your procedure.  There is no need for concern and it should clear up in a day or so.  SYMPTOMS TO REPORT IMMEDIATELY:  Following lower endoscopy (colonoscopy or flexible sigmoidoscopy):  Excessive amounts of blood in the stool  Significant tenderness or worsening of abdominal pains  Swelling of the abdomen that is new, acute  Fever of 100F or higher  Following upper endoscopy (EGD)  Vomiting of blood or coffee ground material  New chest pain or pain under the shoulder blades  Painful or persistently  difficult swallowing  New shortness of breath  Fever of 100F or higher  Black, tarry-looking stools  For urgent or emergent issues, a gastroenterologist can be reached at any hour by calling [phone]. Do not use MyChart messaging for urgent concerns.    DIET:  We do recommend a small meal at first, but then you may proceed to your regular diet.  Drink plenty of fluids but you should avoid alcoholic beverages for 24 hours.  ACTIVITY:  You should plan to take it easy for the rest of today and you should NOT DRIVE or use heavy machinery until tomorrow (because of the sedation medicines used during the test).    FOLLOW UP: Our staff will call the number listed on your records 48-72 hours following your procedure to check on you and address any questions or concerns that you may have regarding the information given to you following your procedure. If we do not reach you, we will leave a message.  We will attempt to reach you two times.  During this call, we will ask if you have developed any symptoms of COVID 19. If you develop any symptoms (ie: fever, flu-like symptoms, shortness of breath, cough etc.) before then, please call [phone].  If you test positive for Covid 19 in the 2 weeks post procedure, please call and report this information to [place].    If any biopsies were taken you will be contacted by phone or by  letter within the next 1-3 weeks.  Please call us at [phone] if you have not heard about the biopsies in 3 weeks.    SIGNATURES/CONFIDENTIALITY: You and/or your care partner have signed paperwork which will be entered into your electronic medical record.  These signatures attest to the fact that that the information above on your After Visit Summary has been reviewed and is understood.  Full responsibility of the confidentiality of this discharge information lies with you and/or your care-partner.

## 2021-01-25 NOTE — Progress Notes (Signed)
0849 Robinul 0.1 mg IV given due large amount of secretions upon assessment.  MD made aware, vss  

## 2021-01-25 NOTE — Progress Notes (Signed)
Called to room to assist during endoscopic procedure.  Patient ID and intended procedure confirmed with present staff. Received instructions for my participation in the procedure from the performing physician.  

## 2021-01-25 NOTE — Progress Notes (Signed)
Report given to PACU, vss 

## 2021-01-26 ENCOUNTER — Other Ambulatory Visit: Payer: Self-pay | Admitting: Family Medicine

## 2021-01-26 MED ORDER — ALLOPURINOL 300 MG PO TABS: 300.0000 mg | ORAL_TABLET | Freq: Every day | ORAL | 6 refills | Status: DC

## 2021-01-26 MED ORDER — COLCHICINE 0.6 MG PO TABS: 0.6000 mg | ORAL_TABLET | Freq: Every day | ORAL | 1 refills | Status: AC

## 2021-01-26 MED ORDER — PREDNISONE 20 MG PO TABS: ORAL_TABLET | ORAL | 0 refills | Status: DC

## 2021-01-27 ENCOUNTER — Telehealth: Payer: Self-pay

## 2021-01-27 NOTE — Telephone Encounter (Signed)
  Follow up Call-  Call back number 01/25/2021  Post procedure Call Back phone  # [phone]  Permission to leave phone message Yes  Some recent data might be hidden     Patient questions:  Do you have a fever, pain , or abdominal swelling? No. Pain Score  0 *  Have you tolerated food without any problems? Yes.    Have you been able to return to your normal activities? Yes.    Do you have any questions about your discharge instructions: Diet   No. Medications  No. Follow up visit  No.  Do you have questions or concerns about your Care? No.  Actions: * If pain score is 4 or above: No action needed, pain <4.  Have you developed a fever since your procedure? no  2.   Have you had an respiratory symptoms (SOB or cough) since your procedure? no  3.   Have you tested positive for COVID 19 since your procedure no  4.   Have you had any family members/close contacts diagnosed with the COVID 19 since your procedure?  no   If yes to any of these questions please route to Joylene John, RN and Joella Prince, RN

## 2021-01-29 ENCOUNTER — Encounter: Payer: Self-pay | Admitting: Gastroenterology

## 2021-02-12 ENCOUNTER — Other Ambulatory Visit: Payer: Self-pay | Admitting: *Deleted

## 2021-02-12 MED ORDER — GABAPENTIN 300 MG PO CAPS: 300.0000 mg | ORAL_CAPSULE | Freq: Every day | ORAL | 1 refills | Status: DC

## 2021-04-08 ENCOUNTER — Other Ambulatory Visit: Payer: Self-pay | Admitting: *Deleted

## 2021-04-08 MED ORDER — LOSARTAN POTASSIUM-HCTZ 100-12.5 MG PO TABS: 1.0000 | ORAL_TABLET | Freq: Every day | ORAL | 2 refills | Status: DC

## 2021-05-20 DIAGNOSIS — L820 Inflamed seborrheic keratosis: Secondary | ICD-10-CM | POA: Diagnosis not present

## 2021-05-20 DIAGNOSIS — Z85828 Personal history of other malignant neoplasm of skin: Secondary | ICD-10-CM | POA: Diagnosis not present

## 2021-05-20 DIAGNOSIS — D225 Melanocytic nevi of trunk: Secondary | ICD-10-CM | POA: Diagnosis not present

## 2021-05-20 DIAGNOSIS — D2261 Melanocytic nevi of right upper limb, including shoulder: Secondary | ICD-10-CM | POA: Diagnosis not present

## 2021-08-14 ENCOUNTER — Other Ambulatory Visit: Payer: Self-pay | Admitting: Family Medicine

## 2021-09-07 ENCOUNTER — Other Ambulatory Visit: Payer: Self-pay | Admitting: Family Medicine

## 2021-10-05 ENCOUNTER — Other Ambulatory Visit: Payer: Self-pay | Admitting: Family Medicine

## 2021-10-06 ENCOUNTER — Other Ambulatory Visit: Payer: Self-pay

## 2021-10-07 ENCOUNTER — Other Ambulatory Visit: Payer: Self-pay

## 2021-10-07 ENCOUNTER — Other Ambulatory Visit: Payer: Medicare Other

## 2021-10-07 DIAGNOSIS — Z0000 Encounter for general adult medical examination without abnormal findings: Secondary | ICD-10-CM

## 2021-10-07 DIAGNOSIS — Z125 Encounter for screening for malignant neoplasm of prostate: Secondary | ICD-10-CM

## 2021-10-07 DIAGNOSIS — R7303 Prediabetes: Secondary | ICD-10-CM

## 2021-10-07 DIAGNOSIS — E7800 Pure hypercholesterolemia, unspecified: Secondary | ICD-10-CM

## 2021-10-07 DIAGNOSIS — Z136 Encounter for screening for cardiovascular disorders: Secondary | ICD-10-CM

## 2021-10-07 DIAGNOSIS — Z1211 Encounter for screening for malignant neoplasm of colon: Secondary | ICD-10-CM

## 2021-10-08 ENCOUNTER — Other Ambulatory Visit: Payer: Medicare Other

## 2021-10-08 LAB — COMPREHENSIVE METABOLIC PANEL
AG Ratio: 2.6 (calc) — ABNORMAL HIGH (ref 1.0–2.5)
ALT: 32 U/L (ref 9–46)
AST: 20 U/L (ref 10–35)
Albumin: 4.6 g/dL (ref 3.6–5.1)
Alkaline phosphatase (APISO): 36 U/L (ref 35–144)
BUN: 17 mg/dL (ref 7–25)
CO2: 27 mmol/L (ref 20–32)
Calcium: 9.6 mg/dL (ref 8.6–10.3)
Chloride: 102 mmol/L (ref 98–110)
Creat: 1.26 mg/dL (ref 0.70–1.35)
Globulin: 1.8 g/dL (calc) — ABNORMAL LOW (ref 1.9–3.7)
Glucose, Bld: 124 mg/dL — ABNORMAL HIGH (ref 65–99)
Potassium: 4.2 mmol/L (ref 3.5–5.3)
Sodium: 141 mmol/L (ref 135–146)
Total Bilirubin: 1 mg/dL (ref 0.2–1.2)
Total Protein: 6.4 g/dL (ref 6.1–8.1)

## 2021-10-08 LAB — CBC WITH DIFFERENTIAL/PLATELET
Absolute Monocytes: 564 cells/uL (ref 200–950)
Basophils Absolute: 57 cells/uL (ref 0–200)
Basophils Relative: 1 %
Eosinophils Absolute: 177 cells/uL (ref 15–500)
Eosinophils Relative: 3.1 %
HCT: 43.6 % (ref 38.5–50.0)
Hemoglobin: 14.6 g/dL (ref 13.2–17.1)
Lymphs Abs: 1807 cells/uL (ref 850–3900)
MCH: 29.9 pg (ref 27.0–33.0)
MCHC: 33.5 g/dL (ref 32.0–36.0)
MCV: 89.2 fL (ref 80.0–100.0)
MPV: 10 fL (ref 7.5–12.5)
Monocytes Relative: 9.9 %
Neutro Abs: 3095 cells/uL (ref 1500–7800)
Neutrophils Relative %: 54.3 %
Platelets: 258 10*3/uL (ref 140–400)
RBC: 4.89 10*6/uL (ref 4.20–5.80)
RDW: 13.7 % (ref 11.0–15.0)
Total Lymphocyte: 31.7 %
WBC: 5.7 10*3/uL (ref 3.8–10.8)

## 2021-10-08 LAB — HEMOGLOBIN A1C
Hgb A1c MFr Bld: 6.8 % of total Hgb — ABNORMAL HIGH (ref <5.7)
Mean Plasma Glucose: 148 mg/dL
eAG (mmol/L): 8.2 mmol/L

## 2021-10-08 LAB — LIPID PANEL
Cholesterol: 142 mg/dL (ref <200)
HDL: 48 mg/dL (ref >=40)
LDL Cholesterol (Calc): 76 mg/dL (calc)
Non-HDL Cholesterol (Calc): 94 mg/dL (calc) (ref <130)
Total CHOL/HDL Ratio: 3 (calc) (ref <5.0)
Triglycerides: 92 mg/dL (ref <150)

## 2021-10-08 LAB — PSA: PSA: 2.03 ng/mL (ref <=4.00)

## 2021-10-12 ENCOUNTER — Encounter: Payer: Medicare Other | Admitting: Family Medicine

## 2021-10-13 ENCOUNTER — Encounter: Payer: Self-pay | Admitting: Family Medicine

## 2021-10-13 ENCOUNTER — Ambulatory Visit (INDEPENDENT_AMBULATORY_CARE_PROVIDER_SITE_OTHER): Payer: PPO | Admitting: Family Medicine

## 2021-10-13 ENCOUNTER — Other Ambulatory Visit: Payer: Self-pay

## 2021-10-13 VITALS — BP 138/98 | HR 56 | Temp 97.2°F | Resp 18 | Ht 70.0 in | Wt 200.0 lb

## 2021-10-13 DIAGNOSIS — E7800 Pure hypercholesterolemia, unspecified: Secondary | ICD-10-CM | POA: Diagnosis not present

## 2021-10-13 DIAGNOSIS — Z23 Encounter for immunization: Secondary | ICD-10-CM | POA: Diagnosis not present

## 2021-10-13 DIAGNOSIS — I10 Essential (primary) hypertension: Secondary | ICD-10-CM | POA: Diagnosis not present

## 2021-10-13 DIAGNOSIS — E118 Type 2 diabetes mellitus with unspecified complications: Secondary | ICD-10-CM

## 2021-10-13 DIAGNOSIS — Z125 Encounter for screening for malignant neoplasm of prostate: Secondary | ICD-10-CM | POA: Diagnosis not present

## 2021-10-13 DIAGNOSIS — Z0000 Encounter for general adult medical examination without abnormal findings: Secondary | ICD-10-CM

## 2021-10-13 MED ORDER — ALLOPURINOL 300 MG PO TABS: 300.0000 mg | ORAL_TABLET | Freq: Every day | ORAL | 3 refills | Status: DC

## 2021-10-13 NOTE — Addendum Note (Signed)
Addended by: Pricilla Handler R on: 10/13/2021 11:52 AM ? ? Modules accepted: Orders ? ?

## 2021-10-13 NOTE — Progress Notes (Signed)
Subjective:    Patient ID: Joseph Tucker, male    DOB: Dec 31, 1955, 66 y.o.   MRN: [id number]  HPI  Patient is a very pleasant 66 year old Caucasian gentleman here today for complete physical exam.  He had his colonoscopy last year.  His PSA was done recently and his PSA has risen from 1.8-2.0 is still well within normal range.  Therefore his cancer screening is up-to-date.  He has a history of diet-controlled diabetes.  His most recent lab work shows an A1c of 6.8.  He denies any polyuria, polydipsia, or blurry vision.  He is due for Prevnar 20.  He has had Pneumovax 23.  He is also due for Shingrix.  He is due for the booster on his COVID shot but he politely declines.  His tetanus shot is up-to-date.  He has not had any gout flares since starting allopurinol.  Unfortunately he continues to have severe pain in his right wrist that is due primarily to osteoarthritis.  His orthopedist is recommended a fusion however he is delaying that due to his career. No visits with results within 1 Day(s) from this visit.  Latest known visit with results is:  Lab on 10/07/2021  Component Date Value Ref Range Status   Hgb A1c MFr Bld 10/07/2021 6.8 (H)  <5.7 % of total Hgb Final   Comment: For someone without known diabetes, a hemoglobin A1c value of 6.5% or greater indicates that they may have  diabetes and this should be confirmed with a follow-up  test. . For someone with known diabetes, a value <7% indicates  that their diabetes is well controlled and a value  greater than or equal to 7% indicates suboptimal  control. A1c targets should be individualized based on  duration of diabetes, age, comorbid conditions, and  other considerations. . Currently, no consensus exists regarding use of hemoglobin A1c for diagnosis of diabetes for children. .    Mean Plasma Glucose 10/07/2021 148  mg/dL Final   eAG (mmol/L) 10/07/2021 8.2  mmol/L Final   PSA 10/07/2021 2.03  < OR = 4.00 ng/mL Final    Comment: The total PSA value from this assay system is  standardized against the WHO standard. The test  result will be approximately 20% lower when compared  to the equimolar-standardized total PSA (Beckman  Coulter). Comparison of serial PSA results should be  interpreted with this fact in mind. . This test was performed using the Siemens  chemiluminescent method. Values obtained from  different assay methods cannot be used interchangeably. PSA levels, regardless of value, should not be interpreted as absolute evidence of the presence or absence of disease.    Cholesterol 10/07/2021 142  <200 mg/dL Final   HDL 10/07/2021 48  > OR = 40 mg/dL Final   Triglycerides 10/07/2021 92  <150 mg/dL Final   LDL Cholesterol (Calc) 10/07/2021 76  mg/dL (calc) Final   Comment: Reference range: <100 . Desirable range <100 mg/dL for primary prevention;   <70 mg/dL for patients with CHD or diabetic patients  with > or = 2 CHD risk factors. Marland Kitchen LDL-C is now calculated using the Martin-Hopkins  calculation, which is a validated novel method providing  better accuracy than the Friedewald equation in the  estimation of LDL-C.  Cresenciano Genre et al. Annamaria Helling. 3875;643(32): 2061-2068  (http://education.[facility].com/faq/FAQ164)    Total CHOL/HDL Ratio 10/07/2021 3.0  <5.0 (calc) Final   Non-HDL Cholesterol (Calc) 10/07/2021 94  <130 mg/dL (calc) Final   Comment: For patients  with diabetes plus 1 major ASCVD risk  factor, treating to a non-HDL-C goal of <100 mg/dL  (LDL-C of <70 mg/dL) is considered a therapeutic  option.    Glucose, Bld 10/07/2021 124 (H)  65 - 99 mg/dL Final   Comment: .            Fasting reference interval . For someone without known diabetes, a glucose value between 100 and 125 mg/dL is consistent with prediabetes and should be confirmed with a follow-up test. .    BUN 10/07/2021 17  7 - 25 mg/dL Final   Creat 10/07/2021 1.26  0.70 - 1.35 mg/dL Final   BUN/Creatinine  Ratio 76/22/6333 NOT APPLICABLE  6 - 22 (calc) Final   Sodium 10/07/2021 141  135 - 146 mmol/L Final   Potassium 10/07/2021 4.2  3.5 - 5.3 mmol/L Final   Chloride 10/07/2021 102  98 - 110 mmol/L Final   CO2 10/07/2021 27  20 - 32 mmol/L Final   Calcium 10/07/2021 9.6  8.6 - 10.3 mg/dL Final   Total Protein 10/07/2021 6.4  6.1 - 8.1 g/dL Final   Albumin 10/07/2021 4.6  3.6 - 5.1 g/dL Final   Globulin 10/07/2021 1.8 (L)  1.9 - 3.7 g/dL (calc) Final   AG Ratio 10/07/2021 2.6 (H)  1.0 - 2.5 (calc) Final   Total Bilirubin 10/07/2021 1.0  0.2 - 1.2 mg/dL Final   Alkaline phosphatase (APISO) 10/07/2021 36  35 - 144 U/L Final   AST 10/07/2021 20  10 - 35 U/L Final   ALT 10/07/2021 32  9 - 46 U/L Final   WBC 10/07/2021 5.7  3.8 - 10.8 Thousand/uL Final   RBC 10/07/2021 4.89  4.20 - 5.80 Million/uL Final   Hemoglobin 10/07/2021 14.6  13.2 - 17.1 g/dL Final   HCT 10/07/2021 43.6  38.5 - 50.0 % Final   MCV 10/07/2021 89.2  80.0 - 100.0 fL Final   MCH 10/07/2021 29.9  27.0 - 33.0 pg Final   MCHC 10/07/2021 33.5  32.0 - 36.0 g/dL Final   RDW 10/07/2021 13.7  11.0 - 15.0 % Final   Platelets 10/07/2021 258  140 - 400 Thousand/uL Final   MPV 10/07/2021 10.0  7.5 - 12.5 fL Final   Neutro Abs 10/07/2021 3,095  1,500 - 7,800 cells/uL Final   Lymphs Abs 10/07/2021 1,807  850 - 3,900 cells/uL Final   Absolute Monocytes 10/07/2021 564  200 - 950 cells/uL Final   Eosinophils Absolute 10/07/2021 177  15 - 500 cells/uL Final   Basophils Absolute 10/07/2021 57  0 - 200 cells/uL Final   Neutrophils Relative % 10/07/2021 54.3  % Final   Total Lymphocyte 10/07/2021 31.7  % Final   Monocytes Relative 10/07/2021 9.9  % Final   Eosinophils Relative 10/07/2021 3.1  % Final   Basophils Relative 10/07/2021 1.0  % Final   Patient has a history of prostatitis Past Medical History:  Diagnosis Date   Colon polyps    GERD (gastroesophageal reflux disease)    Hyperlipidemia    Hypertension    Kidney stones     Pneumonia    Prediabetes    Current Outpatient Medications on File Prior to Visit  Medication Sig Dispense Refill   Cholecalciferol (VITAMIN D3 PO) Take by mouth.     colchicine 0.6 MG tablet Take 1 tablet (0.6 mg total) by mouth daily. Take as directed in message 30 tablet 1   Fexofenadine HCl (ALLEGRA PO) Take by mouth 1 day or  1 dose.     fish oil-omega-3 fatty acids 1000 MG capsule Take 2 g by mouth daily.     gabapentin (NEURONTIN) 300 MG capsule TAKE ONE CAPSULE BY MOUTH AT BEDTIME 90 capsule 0   losartan-hydrochlorothiazide (HYZAAR) 100-12.5 MG tablet Take 1 tablet by mouth daily. 90 tablet 2   MAGNESIUM PO Take by mouth.     pravastatin (PRAVACHOL) 40 MG tablet Take 1 tablet (40 mg total) by mouth daily. 90 tablet 3   vitamin B-12 (CYANOCOBALAMIN) 1000 MCG tablet Take 1,000 mcg by mouth daily.     zinc gluconate 50 MG tablet Take 50 mg by mouth daily.     No current facility-administered medications on file prior to visit.   Past Surgical History:  Procedure Laterality Date   ARTHROSCOPIC REPAIR ACL     CERVICAL SPINE SURGERY     COLONOSCOPY  11/2010   [facility]   HERNIA REPAIR  1979   KNEE SURGERY     RIGHT KNEE   LITHOTRIPSY  08 & 09   NECK SURGERY     Allergies  Allergen Reactions   Codeine Other (See Comments) and Itching    Nausea and passing out Nausea and passing out   Penicillins Itching   Social History   Socioeconomic History   Marital status: Married    Spouse name: Not on file   Number of children: Not on file   Years of education: Not on file   Highest education level: Not on file  Occupational History   Occupation: lineman  Tobacco Use   Smoking status: Never   Smokeless tobacco: Never  Vaping Use   Vaping Use: Never used  Substance and Sexual Activity   Alcohol use: No   Drug use: No   Sexual activity: Yes    Comment: married, owns an [employer]  Other Topics Concern   Not on file  Social History Narrative   Not on  file   Social Determinants of Health   Financial Resource Strain: Not on file  Food Insecurity: Not on file  Transportation Needs: Not on file  Physical Activity: Not on file  Stress: Not on file  Social Connections: Not on file  Intimate Partner Violence: Not on file   Family History  Problem Relation Age of Onset   Colon cancer Father 24   Colon polyps Father    Heart disease Father    Cancer Father        colon   Stomach cancer Mother    Cancer Mother        brain   Leukemia Sister    Cancer Sister        leukemia   Esophageal cancer Neg Hx    Rectal cancer Neg Hx      Review of Systems  All other systems reviewed and are negative.     Objective:   Physical Exam Vitals reviewed.  Constitutional:      General: He is not in acute distress.    Appearance: He is well-developed. He is not diaphoretic.  HENT:     Head: Normocephalic and atraumatic.     Right Ear: External ear normal.     Left Ear: External ear normal.     Nose: Nose normal.     Mouth/Throat:     Pharynx: No oropharyngeal exudate.  Eyes:     General: No scleral icterus.       Right eye: No discharge.  Left eye: No discharge.     Conjunctiva/sclera: Conjunctivae normal.     Pupils: Pupils are equal, round, and reactive to light.  Neck:     Thyroid: No thyromegaly.     Vascular: No JVD.     Trachea: No tracheal deviation.  Cardiovascular:     Rate and Rhythm: Normal rate and regular rhythm.     Heart sounds: Normal heart sounds. No murmur heard.   No friction rub. No gallop.  Pulmonary:     Effort: Pulmonary effort is normal. No respiratory distress.     Breath sounds: Normal breath sounds. No stridor. No wheezing or rales.  Chest:     Chest wall: No tenderness.  Abdominal:     General: Bowel sounds are normal. There is no distension.     Palpations: Abdomen is soft. There is no mass.     Tenderness: There is no abdominal tenderness. There is no guarding or rebound.   Musculoskeletal:        General: No tenderness or deformity.     Cervical back: Normal range of motion and neck supple.  Lymphadenopathy:     Cervical: No cervical adenopathy.  Skin:    General: Skin is warm.     Coloration: Skin is not pale.     Findings: No erythema or rash.  Neurological:     Mental Status: He is alert and oriented to person, place, and time.     Cranial Nerves: No cranial nerve deficit.     Motor: No abnormal muscle tone.     Coordination: Coordination normal.     Deep Tendon Reflexes: Reflexes are normal and symmetric.  Psychiatric:        Behavior: Behavior normal.        Thought Content: Thought content normal.        Judgment: Judgment normal.          Assessment & Plan:  General medical exam  Prostate cancer screening  Pure hypercholesterolemia  Controlled type 2 diabetes mellitus with complication, without long-term current use of insulin (HCC)  Benign essential HTN My biggest concern with his exam today is his blood pressure.  Patient elects to check his blood pressure daily at home for 1 week and report values to me.  If consistently greater than 90 diastolic, we could either increase his hydrochlorothiazide to 25 or add a low-dose amlodipine.  He received Prevnar 20 today.  I recommended Shingrix as well as COVID booster.  His cholesterol is excellent.  Kidney and liver function are stable.  Blood sugar is now at a diabetic level.  However it is diet-controlled.  I recommended weight loss and a low carbohydrate diet.  Reassess in 6 months.  Patient denies any falls, depression, or memory loss.

## 2021-10-22 ENCOUNTER — Telehealth: Payer: Self-pay | Admitting: Family Medicine

## 2021-10-22 MED ORDER — PRAVASTATIN SODIUM 40 MG PO TABS: 40.0000 mg | ORAL_TABLET | Freq: Every day | ORAL | 3 refills | Status: DC

## 2021-10-22 NOTE — Telephone Encounter (Signed)
Rx sent to pharmacy   

## 2021-10-22 NOTE — Telephone Encounter (Signed)
Received eFax from pharmacy to request refill of pravastatin (PRAVACHOL) 40 MG tablet [[id number]]  ? ?Pharmacy fax received from: ? ?[facility] [id number] [place], [place]  ?[address] [place], [place] [address]  ?Phone:  [phone]  Fax:  [phone]  ? ?Please advise pharmacist.  ?

## 2021-11-04 DIAGNOSIS — H2513 Age-related nuclear cataract, bilateral: Secondary | ICD-10-CM | POA: Diagnosis not present

## 2021-11-04 DIAGNOSIS — H524 Presbyopia: Secondary | ICD-10-CM | POA: Diagnosis not present

## 2021-11-15 ENCOUNTER — Other Ambulatory Visit: Payer: Self-pay | Admitting: Family Medicine

## 2021-12-16 ENCOUNTER — Encounter: Payer: Self-pay | Admitting: Family Medicine

## 2021-12-16 ENCOUNTER — Other Ambulatory Visit: Payer: Self-pay | Admitting: Family Medicine

## 2021-12-16 MED ORDER — HYDROCODONE BIT-HOMATROP MBR 5-1.5 MG/5ML PO SOLN: 5.0000 mL | Freq: Three times a day (TID) | ORAL | 0 refills | Status: DC | PRN

## 2022-01-06 ENCOUNTER — Other Ambulatory Visit: Payer: Self-pay

## 2022-01-06 NOTE — Telephone Encounter (Signed)
Pharmacy faxed a refill request for losartan-hydrochlorothiazide (HYZAAR) 100-12.5 MG tablet [[id number]]    Order Details Dose: 1 tablet Route: Oral Frequency: Daily  Dispense Quantity: 90 tablet Refills: 2        Sig: Take 1 tablet by mouth daily.       Start Date: 04/08/21 End Date: --  Written Date: 04/08/21 Expiration Date: 04/08/22

## 2022-01-07 MED ORDER — LOSARTAN POTASSIUM-HCTZ 100-12.5 MG PO TABS
1.0000 | ORAL_TABLET | Freq: Every day | ORAL | 0 refills | Status: DC
Start: 2022-01-07 — End: 2022-04-18

## 2022-01-07 NOTE — Telephone Encounter (Signed)
Last general medical exam 2 months ago. Future visit 10/17/22. Requested Prescriptions  Pending Prescriptions Disp Refills  . losartan-hydrochlorothiazide (HYZAAR) 100-12.5 MG tablet 90 tablet 0    Sig: Take 1 tablet by mouth daily.     Cardiovascular: ARB + Diuretic Combos Failed - 01/06/2022 11:19 AM      Failed - eGFR is 10 or above and within 180 days    GFR, Est African American  Date Value Ref Range Status  10/08/2020 76 > OR = 60 mL/min/1.46m Final   GFR, Est Non African American  Date Value Ref Range Status  10/08/2020 66 > OR = 60 mL/min/1.733mFinal   GFR  Date Value Ref Range Status  11/04/2010 81.29 >60.00 mL/min Final         Failed - Last BP in normal range    BP Readings from Last 1 Encounters:  10/13/21 (!) 138/98         Failed - Valid encounter within last 6 months    Recent Outpatient Visits          2 months ago [facility] medical exam   [facility]   1 year ago [place] cancer screening   [facility]   1 year ago [place]ough   [place]   2 years ago Controlled type 2 diabetes mellitus with complication, without long-term current use of insulin ([facility]  [place]   3 years ago Chronic fatigue   [place]             Passed - K in normal range and within 180 days    Potassium  Date Value Ref Range Status  10/07/2021 4.2 3.5 - 5.3 mmol/L Final         Passed - Na in normal range and within 180 days    Sodium  Date Value Ref Range Status  10/07/2021 141 135 - 146 mmol/L Final         Passed - Cr in normal range and within 180 days    Creat  Date Value Ref Range Status  10/07/2021 1.26 0.70 - 1.35 mg/dL Final         Passed - Patient is not pregnant

## 2022-02-12 ENCOUNTER — Other Ambulatory Visit: Payer: Self-pay | Admitting: Family Medicine

## 2022-02-14 NOTE — Telephone Encounter (Signed)
Requested Prescriptions  Pending Prescriptions Disp Refills  . gabapentin (NEURONTIN) 300 MG capsule [Pharmacy Med Name: GABAPENTIN 300 MG CAPSULE] 90 capsule 0    Sig: TAKE ONE CAPSULE BY MOUTH AT BEDTIME; **MUST CALL MD FOR APPOINTMENT FOR FUTURE REFILLS !     Neurology: Anticonvulsants - gabapentin Failed - 02/12/2022  6:41 AM      Failed - Valid encounter within last 12 months    Recent Outpatient Visits          4 months ago General medical exam   [place] Susy Frizzle, MD   1 year ago Colon cancer screening   [place] [facility] Cammie Mcgee, MD   1 year ago Cough   [place] Susy Frizzle, MD   2 years ago Controlled type 2 diabetes mellitus with complication, without long-term current use of insulin ([place])   [place], Cammie Mcgee, MD   3 years ago Chronic fatigue   [place], Cammie Mcgee, MD             Passed - Cr in normal range and within 360 days    Creat  Date Value Ref Range Status  10/07/2021 1.26 0.70 - 1.35 mg/dL Final         Passed - Completed PHQ-2 or PHQ-9 in the last 360 days

## 2022-04-18 ENCOUNTER — Other Ambulatory Visit: Payer: Self-pay | Admitting: Family Medicine

## 2022-04-18 MED ORDER — LOSARTAN POTASSIUM-HCTZ 100-12.5 MG PO TABS: 1.0000 | ORAL_TABLET | Freq: Every day | ORAL | 0 refills | Status: DC

## 2022-05-11 ENCOUNTER — Encounter: Payer: Self-pay | Admitting: Family Medicine

## 2022-05-13 ENCOUNTER — Other Ambulatory Visit: Payer: Self-pay | Admitting: Family Medicine

## 2022-05-13 NOTE — Telephone Encounter (Signed)
Requested Prescriptions  Pending Prescriptions Disp Refills  . gabapentin (NEURONTIN) 300 MG capsule [Pharmacy Med Name: GABAPENTIN 300 MG CAPSULE] 90 capsule 1    Sig: TAKE 1 CAPSULE BY MOUTH AT BEDTIME   NEEDS APPOINTMENT     Neurology: Anticonvulsants - gabapentin Failed - 05/13/2022  1:04 PM      Failed - Valid encounter within last 12 months    Recent Outpatient Visits          7 months ago General medical exam   [place] Susy Frizzle, MD   1 year ago Colon cancer screening   [place] Susy Frizzle, MD   1 year ago Cough   [place] Susy Frizzle, MD   2 years ago Controlled type 2 diabetes mellitus with complication, without long-term current use of insulin ([place])   [place], Cammie Mcgee, MD   4 years ago Chronic fatigue   [place] Pickard, Cammie Mcgee, MD             Passed - Cr in normal range and within 360 days    Creat  Date Value Ref Range Status  10/07/2021 1.26 0.70 - 1.35 mg/dL Final         Passed - Completed PHQ-2 or PHQ-9 in the last 360 days

## 2022-05-24 DIAGNOSIS — L570 Actinic keratosis: Secondary | ICD-10-CM | POA: Diagnosis not present

## 2022-05-24 DIAGNOSIS — D2261 Melanocytic nevi of right upper limb, including shoulder: Secondary | ICD-10-CM | POA: Diagnosis not present

## 2022-05-24 DIAGNOSIS — L821 Other seborrheic keratosis: Secondary | ICD-10-CM | POA: Diagnosis not present

## 2022-05-24 DIAGNOSIS — D2262 Melanocytic nevi of left upper limb, including shoulder: Secondary | ICD-10-CM | POA: Diagnosis not present

## 2022-05-24 DIAGNOSIS — Z85828 Personal history of other malignant neoplasm of skin: Secondary | ICD-10-CM | POA: Diagnosis not present

## 2022-05-24 DIAGNOSIS — L918 Other hypertrophic disorders of the skin: Secondary | ICD-10-CM | POA: Diagnosis not present

## 2022-05-24 DIAGNOSIS — D225 Melanocytic nevi of trunk: Secondary | ICD-10-CM | POA: Diagnosis not present

## 2022-07-15 ENCOUNTER — Other Ambulatory Visit: Payer: Self-pay | Admitting: Family Medicine

## 2022-07-18 ENCOUNTER — Other Ambulatory Visit: Payer: Self-pay | Admitting: Family Medicine

## 2022-10-11 ENCOUNTER — Other Ambulatory Visit: Payer: Self-pay | Admitting: Family Medicine

## 2022-10-11 ENCOUNTER — Encounter: Payer: Self-pay | Admitting: Family Medicine

## 2022-10-12 ENCOUNTER — Other Ambulatory Visit: Payer: PPO

## 2022-10-12 DIAGNOSIS — Z125 Encounter for screening for malignant neoplasm of prostate: Secondary | ICD-10-CM | POA: Diagnosis not present

## 2022-10-12 DIAGNOSIS — E7800 Pure hypercholesterolemia, unspecified: Secondary | ICD-10-CM | POA: Diagnosis not present

## 2022-10-12 DIAGNOSIS — E118 Type 2 diabetes mellitus with unspecified complications: Secondary | ICD-10-CM | POA: Diagnosis not present

## 2022-10-12 DIAGNOSIS — I10 Essential (primary) hypertension: Secondary | ICD-10-CM | POA: Diagnosis not present

## 2022-10-12 MED ORDER — LOSARTAN POTASSIUM-HCTZ 100-12.5 MG PO TABS: 1.0000 | ORAL_TABLET | Freq: Every day | ORAL | 0 refills | Status: DC

## 2022-10-13 LAB — COMPLETE METABOLIC PANEL WITH GFR
AG Ratio: 1.8 (calc) (ref 1.0–2.5)
ALT: 21 U/L (ref 9–46)
AST: 15 U/L (ref 10–35)
Albumin: 4.3 g/dL (ref 3.6–5.1)
Alkaline phosphatase (APISO): 45 U/L (ref 35–144)
BUN: 17 mg/dL (ref 7–25)
CO2: 27 mmol/L (ref 20–32)
Calcium: 9.7 mg/dL (ref 8.6–10.3)
Chloride: 105 mmol/L (ref 98–110)
Creat: 1.12 mg/dL (ref 0.70–1.35)
Globulin: 2.4 g/dL (calc) (ref 1.9–3.7)
Glucose, Bld: 110 mg/dL — ABNORMAL HIGH (ref 65–99)
Potassium: 4 mmol/L (ref 3.5–5.3)
Sodium: 141 mmol/L (ref 135–146)
Total Bilirubin: 1 mg/dL (ref 0.2–1.2)
Total Protein: 6.7 g/dL (ref 6.1–8.1)
eGFR: 72 mL/min/{1.73_m2} (ref >=60)

## 2022-10-13 LAB — CBC WITH DIFFERENTIAL/PLATELET
Absolute Monocytes: 431 cells/uL (ref 200–950)
Basophils Absolute: 62 cells/uL (ref 0–200)
Basophils Relative: 1.1 %
Eosinophils Absolute: 151 cells/uL (ref 15–500)
Eosinophils Relative: 2.7 %
HCT: 44.6 % (ref 38.5–50.0)
Hemoglobin: 14.7 g/dL (ref 13.2–17.1)
Lymphs Abs: 1596 cells/uL (ref 850–3900)
MCH: 29.7 pg (ref 27.0–33.0)
MCHC: 33 g/dL (ref 32.0–36.0)
MCV: 90.1 fL (ref 80.0–100.0)
MPV: 10 fL (ref 7.5–12.5)
Monocytes Relative: 7.7 %
Neutro Abs: 3360 cells/uL (ref 1500–7800)
Neutrophils Relative %: 60 %
Platelets: 265 10*3/uL (ref 140–400)
RBC: 4.95 10*6/uL (ref 4.20–5.80)
RDW: 13.4 % (ref 11.0–15.0)
Total Lymphocyte: 28.5 %
WBC: 5.6 10*3/uL (ref 3.8–10.8)

## 2022-10-13 LAB — LIPID PANEL
Cholesterol: 148 mg/dL (ref <200)
HDL: 53 mg/dL (ref >=40)
LDL Cholesterol (Calc): 80 mg/dL (calc)
Non-HDL Cholesterol (Calc): 95 mg/dL (calc) (ref <130)
Total CHOL/HDL Ratio: 2.8 (calc) (ref <5.0)
Triglycerides: 71 mg/dL (ref <150)

## 2022-10-13 LAB — PSA: PSA: 2.39 ng/mL (ref <=4.00)

## 2022-10-13 LAB — HEMOGLOBIN A1C
Hgb A1c MFr Bld: 6.5 % of total Hgb — ABNORMAL HIGH (ref <5.7)
Mean Plasma Glucose: 140 mg/dL
eAG (mmol/L): 7.7 mmol/L

## 2022-10-13 LAB — MICROALBUMIN / CREATININE URINE RATIO
Creatinine, Urine: 137 mg/dL (ref 20–320)
Microalb Creat Ratio: 3 mcg/mg creat (ref <30)
Microalb, Ur: 0.4 mg/dL

## 2022-10-13 LAB — VITAMIN B12: Vitamin B-12: >2000 pg/mL — ABNORMAL HIGH (ref 200–1100)

## 2022-10-15 ENCOUNTER — Other Ambulatory Visit: Payer: Self-pay | Admitting: Family Medicine

## 2022-10-17 ENCOUNTER — Ambulatory Visit (INDEPENDENT_AMBULATORY_CARE_PROVIDER_SITE_OTHER): Payer: PPO | Admitting: Family Medicine

## 2022-10-17 ENCOUNTER — Encounter: Payer: Self-pay | Admitting: Family Medicine

## 2022-10-17 VITALS — BP 122/76 | HR 69 | Temp 98.6°F | Ht 70.0 in | Wt 195.8 lb

## 2022-10-17 DIAGNOSIS — Z0001 Encounter for general adult medical examination with abnormal findings: Secondary | ICD-10-CM | POA: Diagnosis not present

## 2022-10-17 DIAGNOSIS — Z125 Encounter for screening for malignant neoplasm of prostate: Secondary | ICD-10-CM | POA: Diagnosis not present

## 2022-10-17 DIAGNOSIS — I10 Essential (primary) hypertension: Secondary | ICD-10-CM

## 2022-10-17 DIAGNOSIS — E7800 Pure hypercholesterolemia, unspecified: Secondary | ICD-10-CM | POA: Diagnosis not present

## 2022-10-17 DIAGNOSIS — E118 Type 2 diabetes mellitus with unspecified complications: Secondary | ICD-10-CM

## 2022-10-17 DIAGNOSIS — Z0000 Encounter for general adult medical examination without abnormal findings: Secondary | ICD-10-CM

## 2022-10-17 NOTE — Progress Notes (Signed)
Subjective:    Patient ID: Joseph Tucker, male    DOB: November 11, 1955, 67 y.o.   MRN: [id number]  HPI  Patient is a very pleasant 67 year old Caucasian gentleman here today for complete physical exam.  He had his colonoscopy in 2022.  He is due for repeat colonoscopy in 2027 due to the presence of polyps.  His PSA was just recently checked and found to be within normal range.  There is some mild elevation consistent with BPH.  Patient denies any symptoms that are concerning.  He is due for shingles vaccine.  Prevnar 20 and Pneumovax 23 are up-to-date.  He is due for tetanus shot next year.  He declines RSV.  He declines COVID booster. Lab on 10/12/2022  Component Date Value Ref Range Status   WBC 10/12/2022 5.6  3.8 - 10.8 Thousand/uL Final   RBC 10/12/2022 4.95  4.20 - 5.80 Million/uL Final   Hemoglobin 10/12/2022 14.7  13.2 - 17.1 g/dL Final   HCT 10/12/2022 44.6  38.5 - 50.0 % Final   MCV 10/12/2022 90.1  80.0 - 100.0 fL Final   MCH 10/12/2022 29.7  27.0 - 33.0 pg Final   MCHC 10/12/2022 33.0  32.0 - 36.0 g/dL Final   RDW 10/12/2022 13.4  11.0 - 15.0 % Final   Platelets 10/12/2022 265  140 - 400 Thousand/uL Final   MPV 10/12/2022 10.0  7.5 - 12.5 fL Final   Neutro Abs 10/12/2022 3,360  1,500 - 7,800 cells/uL Final   Lymphs Abs 10/12/2022 1,596  850 - 3,900 cells/uL Final   Absolute Monocytes 10/12/2022 431  200 - 950 cells/uL Final   Eosinophils Absolute 10/12/2022 151  15 - 500 cells/uL Final   Basophils Absolute 10/12/2022 62  0 - 200 cells/uL Final   Neutrophils Relative % 10/12/2022 60  % Final   Total Lymphocyte 10/12/2022 28.5  % Final   Monocytes Relative 10/12/2022 7.7  % Final   Eosinophils Relative 10/12/2022 2.7  % Final   Basophils Relative 10/12/2022 1.1  % Final   Glucose, Bld 10/12/2022 110 (H)  65 - 99 mg/dL Final   Comment: .            Fasting reference interval . For someone without known diabetes, a glucose value between 100 and 125 mg/dL is consistent  with prediabetes and should be confirmed with a follow-up test. .    BUN 10/12/2022 17  7 - 25 mg/dL Final   Creat 10/12/2022 1.12  0.70 - 1.35 mg/dL Final   eGFR 10/12/2022 72  > OR = 60 mL/min/1.56m Final   BUN/Creatinine Ratio 10/12/2022 SEE NOTE:  6 - 22 (calc) Final   Comment:    Not Reported: BUN and Creatinine are within    reference range. .    Sodium 10/12/2022 141  135 - 146 mmol/L Final   Potassium 10/12/2022 4.0  3.5 - 5.3 mmol/L Final   Chloride 10/12/2022 105  98 - 110 mmol/L Final   CO2 10/12/2022 27  20 - 32 mmol/L Final   Calcium 10/12/2022 9.7  8.6 - 10.3 mg/dL Final   Total Protein 10/12/2022 6.7  6.1 - 8.1 g/dL Final   Albumin 10/12/2022 4.3  3.6 - 5.1 g/dL Final   Globulin 10/12/2022 2.4  1.9 - 3.7 g/dL (calc) Final   AG Ratio 10/12/2022 1.8  1.0 - 2.5 (calc) Final   Total Bilirubin 10/12/2022 1.0  0.2 - 1.2 mg/dL Final   Alkaline phosphatase (APISO) 10/12/2022 45  35 - 144 U/L Final   AST 10/12/2022 15  10 - 35 U/L Final   ALT 10/12/2022 21  9 - 46 U/L Final   Hgb A1c MFr Bld 10/12/2022 6.5 (H)  <5.7 % of total Hgb Final   Comment: For someone without known diabetes, a hemoglobin A1c value of 6.5% or greater indicates that they may have  diabetes and this should be confirmed with a follow-up  test. . For someone with known diabetes, a value <7% indicates  that their diabetes is well controlled and a value  greater than or equal to 7% indicates suboptimal  control. A1c targets should be individualized based on  duration of diabetes, age, comorbid conditions, and  other considerations. . Currently, no consensus exists regarding use of hemoglobin A1c for diagnosis of diabetes for children. .    Mean Plasma Glucose 10/12/2022 140  mg/dL Final   eAG (mmol/L) 10/12/2022 7.7  mmol/L Final   Comment: . HbA1c performed on Roche platform. Effective 05/16/22 a change in test platforms may have  shifted HbA1c results compared to historical results.     Cholesterol 10/12/2022 148  <200 mg/dL Final   HDL 10/12/2022 53  > OR = 40 mg/dL Final   Triglycerides 10/12/2022 71  <150 mg/dL Final   LDL Cholesterol (Calc) 10/12/2022 80  mg/dL (calc) Final   Comment: Reference range: <100 . Desirable range <100 mg/dL for primary prevention;   <70 mg/dL for patients with CHD or diabetic patients  with > or = 2 CHD risk factors. Marland Kitchen LDL-C is now calculated using the Martin-Hopkins  calculation, which is a validated novel method providing  better accuracy than the Friedewald equation in the  estimation of LDL-C.  Cresenciano Genre et al. Annamaria Helling. [id number]): 2061-2068  (http://education.[facility].com/faq/FAQ164)    Total CHOL/HDL Ratio 10/12/2022 2.8  <5.0 (calc) Final   Non-HDL Cholesterol (Calc) 10/12/2022 95  <130 mg/dL (calc) Final   Comment: For patients with diabetes plus 1 major ASCVD risk  factor, treating to a non-HDL-C goal of <100 mg/dL  (LDL-C of <70 mg/dL) is considered a therapeutic  option.    Creatinine, Urine 10/12/2022 137  20 - 320 mg/dL Final   Microalb, Ur 10/12/2022 0.4  mg/dL Final   Comment: Reference Range Not established    Microalb Creat Ratio 10/12/2022 3  <30 mcg/mg creat Final   Comment: . The ADA defines abnormalities in albumin excretion as follows: Marland Kitchen Albuminuria Category        Result (mcg/mg creatinine) . Normal to Mildly increased   <30 Moderately increased         30-299  Severely increased           > OR = 300 . The ADA recommends that at least two of three specimens collected within a 3-6 month period be abnormal before considering a patient to be within a diagnostic category.    Vitamin B-12 10/12/2022 >2,000 (H)  200 - 1,100 pg/mL Final   PSA 10/12/2022 2.39  < OR = 4.00 ng/mL Final   Comment: The total PSA value from this assay system is  standardized against the WHO standard. The test  result will be approximately 20% lower when compared  to the equimolar-standardized total PSA (Beckman   Coulter). Comparison of serial PSA results should be  interpreted with this fact in mind. . This test was performed using the Siemens  chemiluminescent method. Values obtained from  different assay methods cannot be used interchangeably. PSA levels, regardless of  value, should not be interpreted as absolute evidence of the presence or absence of disease.   Abstract on 10/11/2022  Component Date Value Ref Range Status   HM Diabetic Eye Exam 10/20/2020 No Retinopathy  No Retinopathy Final    Past Medical History:  Diagnosis Date   Colon polyps    Diabetes mellitus without complication (HCC)    GERD (gastroesophageal reflux disease)    Hyperlipidemia    Hypertension    Kidney stones    Pneumonia    Prediabetes    Current Outpatient Medications on File Prior to Visit  Medication Sig Dispense Refill   allopurinol (ZYLOPRIM) 300 MG tablet Take 1 tablet (300 mg total) by mouth daily. 90 tablet 3   Cholecalciferol (VITAMIN D3 PO) Take by mouth.     colchicine 0.6 MG tablet Take 1 tablet (0.6 mg total) by mouth daily. Take as directed in message 30 tablet 1   Fexofenadine HCl (ALLEGRA PO) Take by mouth 1 day or 1 dose.     fish oil-omega-3 fatty acids 1000 MG capsule Take 2 g by mouth daily.     gabapentin (NEURONTIN) 300 MG capsule TAKE 1 CAPSULE BY MOUTH AT BEDTIME   NEEDS APPOINTMENT 90 capsule 1   losartan-hydrochlorothiazide (HYZAAR) 100-12.5 MG tablet Take 1 tablet by mouth daily. 90 tablet 0   MAGNESIUM PO Take by mouth.     pravastatin (PRAVACHOL) 40 MG tablet Take 1 tablet (40 mg total) by mouth daily. 90 tablet 3   vitamin B-12 (CYANOCOBALAMIN) 1000 MCG tablet Take 1,000 mcg by mouth daily.     zinc gluconate 50 MG tablet Take 50 mg by mouth daily.     No current facility-administered medications on file prior to visit.   Past Surgical History:  Procedure Laterality Date   ARTHROSCOPIC REPAIR ACL     CERVICAL SPINE SURGERY     COLONOSCOPY  11/2010   [facility]    HERNIA REPAIR  1979   KNEE SURGERY     RIGHT KNEE   LITHOTRIPSY  08 & 09   NECK SURGERY     Allergies  Allergen Reactions   Codeine Other (See Comments) and Itching    Nausea and passing out Nausea and passing out   Penicillins Itching   Social History   Socioeconomic History   Marital status: Married    Spouse name: Not on file   Number of children: Not on file   Years of education: Not on file   Highest education level: Not on file  Occupational History   Occupation: lineman  Tobacco Use   Smoking status: Never   Smokeless tobacco: Never  Vaping Use   Vaping Use: Never used  Substance and Sexual Activity   Alcohol use: No   Drug use: No   Sexual activity: Yes    Comment: married, owns an Hy[employer]  Other Topics Concern   Not on file  Social History Narrative   Not on file   Social Determinants of Health   Financial Resource Strain: Not on file  Food Insecurity: Not on file  Transportation Needs: Not on file  Physical Activity: Not on file  Stress: Not on file  Social Connections: Not on file  Intimate Partner Violence: Not on file   Family History  Problem Relation Age of Onset   Colon cancer Father 76   Colon polyps Father    Heart disease Father    Cancer Father        colon  Stomach cancer Mother    Cancer Mother        brain   Leukemia Sister    Cancer Sister        leukemia   Esophageal cancer Neg Hx    Rectal cancer Neg Hx      Review of Systems  All other systems reviewed and are negative.      Objective:   Physical Exam Vitals reviewed.  Constitutional:      General: He is not in acute distress.    Appearance: He is well-developed. He is not diaphoretic.  HENT:     Head: Normocephalic and atraumatic.     Right Ear: External ear normal.     Left Ear: External ear normal.     Nose: Nose normal.     Mouth/Throat:     Pharynx: No oropharyngeal exudate.  Eyes:     General: No scleral icterus.       Right  eye: No discharge.        Left eye: No discharge.     Conjunctiva/sclera: Conjunctivae normal.     Pupils: Pupils are equal, round, and reactive to light.  Neck:     Thyroid: No thyromegaly.     Vascular: No JVD.     Trachea: No tracheal deviation.  Cardiovascular:     Rate and Rhythm: Normal rate and regular rhythm.     Heart sounds: Normal heart sounds. No murmur heard.    No friction rub. No gallop.  Pulmonary:     Effort: Pulmonary effort is normal. No respiratory distress.     Breath sounds: Normal breath sounds. No stridor. No wheezing or rales.  Chest:     Chest wall: No tenderness.  Abdominal:     General: Bowel sounds are normal. There is no distension.     Palpations: Abdomen is soft. There is no mass.     Tenderness: There is no abdominal tenderness. There is no guarding or rebound.  Musculoskeletal:        General: No tenderness or deformity.     Cervical back: Normal range of motion and neck supple.  Lymphadenopathy:     Cervical: No cervical adenopathy.  Skin:    General: Skin is warm.     Coloration: Skin is not pale.     Findings: No erythema or rash.  Neurological:     Mental Status: He is alert and oriented to person, place, and time.     Cranial Nerves: No cranial nerve deficit.     Motor: No abnormal muscle tone.     Coordination: Coordination normal.     Deep Tendon Reflexes: Reflexes are normal and symmetric.  Psychiatric:        Behavior: Behavior normal.        Thought Content: Thought content normal.        Judgment: Judgment normal.           Assessment & Plan:  General medical exam  Prostate cancer screening  Pure hypercholesterolemia  Controlled type 2 diabetes mellitus with complication, without long-term current use of insulin (HCC)  Benign essential HTN A1c is outstanding at 6.5.  Blood pressure is well-controlled.  Cholesterol is outstanding.  Liver and kidney test are normal.  Recommended the shingles vaccine.  Colonoscopy is  up-to-date.  PSA is within normal limits.  Diabetic foot exam was performed today and was normal.  Regular anticipatory guidance is provided.  He denies any falls, depression, or memory loss

## 2022-10-25 ENCOUNTER — Telehealth: Payer: Self-pay | Admitting: Family Medicine

## 2022-10-25 ENCOUNTER — Other Ambulatory Visit: Payer: Self-pay

## 2022-10-25 DIAGNOSIS — E7800 Pure hypercholesterolemia, unspecified: Secondary | ICD-10-CM

## 2022-10-25 MED ORDER — PRAVASTATIN SODIUM 40 MG PO TABS: 40.0000 mg | ORAL_TABLET | Freq: Every day | ORAL | 2 refills | Status: DC

## 2022-10-25 NOTE — Telephone Encounter (Signed)
Prescription Request  10/25/2022  LOV: 10/17/2022  What is the name of the medication or equipment?   pravastatin (PRAVACHOL) 40 MG tablet   Have you contacted your pharmacy to request a refill? Yes   Which pharmacy would you like this sent to?    [id number] [place] [address] [place], [place] [address] Phone: [phone]  Fax: [phone]   Patient notified that their request is being sent to the clinical staff for review and that they should receive a response within 2 business days.   Please advise patient when refill sent at [phone].

## 2022-11-07 DIAGNOSIS — E1136 Type 2 diabetes mellitus with diabetic cataract: Secondary | ICD-10-CM | POA: Diagnosis not present

## 2022-11-07 DIAGNOSIS — H25813 Combined forms of age-related cataract, bilateral: Secondary | ICD-10-CM | POA: Diagnosis not present

## 2022-11-07 DIAGNOSIS — H524 Presbyopia: Secondary | ICD-10-CM | POA: Diagnosis not present

## 2022-11-07 LAB — HM DIABETES EYE EXAM

## 2022-11-12 ENCOUNTER — Other Ambulatory Visit: Payer: Self-pay | Admitting: Family Medicine

## 2022-11-14 ENCOUNTER — Other Ambulatory Visit: Payer: Self-pay | Admitting: Family Medicine

## 2023-02-11 ENCOUNTER — Other Ambulatory Visit: Payer: Self-pay | Admitting: Family Medicine

## 2023-02-13 ENCOUNTER — Other Ambulatory Visit: Payer: Self-pay | Admitting: Family Medicine

## 2023-02-13 NOTE — Telephone Encounter (Signed)
Requested medication (s) are due for refill today:   Yes for both  Requested medication (s) are on the active medication list:   Yes for both  Future visit scheduled:   Yes  10/19/2023     LOV 10/17/2022   Last ordered: Both 11/14/2022 #90, 0 refills  Returned because a uric acid level is due per protocol.

## 2023-03-15 NOTE — Progress Notes (Signed)
Subjective:   Joseph Tucker is a 67 y.o. male who presents for Medicare Annual/Subsequent preventive examination.  Visit Complete: {VISITMETHOD:[phone]}  Patient Medicare AWV questionnaire was completed by the patient on ***; I have confirmed that all information answered by patient is correct and no changes since this date.  Vital Signs: {telehealth vitals:30100}  Review of Systems    ***       Objective:    There were no vitals filed for this visit. There is no height or weight on file to calculate BMI.     01/14/2016    7:59 AM  Advanced Directives  Does Patient Have a Medical Advance Directive? No  Would patient like information on creating a medical advance directive? No - patient declined information    Current Medications (verified) Outpatient Encounter Medications as of 03/16/2023  Medication Sig   allopurinol (ZYLOPRIM) 300 MG tablet TAKE 1 TABLET BY MOUTH DAILY   Cholecalciferol (VITAMIN D3 PO) Take by mouth.   colchicine 0.6 MG tablet Take 1 tablet (0.6 mg total) by mouth daily. Take as directed in message   Fexofenadine HCl (ALLEGRA PO) Take by mouth 1 day or 1 dose.   fish oil-omega-3 fatty acids 1000 MG capsule Take 2 g by mouth daily.   gabapentin (NEURONTIN) 300 MG capsule TAKE 1 CAPSULE BY MOUTH AT BEDTIME ***NEEDS APPOINTMENT***   losartan-hydrochlorothiazide (HYZAAR) 100-12.5 MG tablet TAKE 1 TABLET BY MOUTH DAILY   MAGNESIUM PO Take by mouth.   pravastatin (PRAVACHOL) 40 MG tablet Take 1 tablet (40 mg total) by mouth daily.   vitamin B-12 (CYANOCOBALAMIN) 1000 MCG tablet Take 1,000 mcg by mouth daily.   zinc gluconate 50 MG tablet Take 50 mg by mouth daily.   No facility-administered encounter medications on file as of 03/16/2023.    Allergies (verified) Codeine and Penicillins   History: Past Medical History:  Diagnosis Date   Colon polyps    Diabetes mellitus without complication (HCC)    GERD (gastroesophageal reflux disease)     Hyperlipidemia    Hypertension    Kidney stones    Pneumonia    Prediabetes    Past Surgical History:  Procedure Laterality Date   ARTHROSCOPIC REPAIR ACL     CERVICAL SPINE SURGERY     [facility]OSCOPY  11/2010   [facility]   HERNIA REPAIR  1979   KNEE SURGERY     RIGHT KNEE   LITHOTRIPSY  08 & 09   NECK SURGERY     Family History  Problem Relation Age of Onset   Colon cancer Father 54   Colon polyps Father    Heart disease Father    Cancer Father        colon   Stomach cancer Mother    Cancer Mother        brain   Leukemia Sister    Cancer Sister        leukemia   Esophageal cancer Neg Hx    Rectal cancer Neg Hx    Social History   Socioeconomic History   Marital status: Married    Spouse name: Not on file   Number of children: Not on file   Years of education: Not on file   Highest education level: Not on file  Occupational History   Occupation: lineman  Tobacco Use   Smoking status: Never   Smokeless tobacco: Never  Vaping Use   Vaping status: Never Used  Substance and Sexual Activity   Alcohol use: No  Drug use: No   Sexual activity: Yes    Comment: married, owns an Charity fundraiser  Other Topics Concern   Not on file  Social History Narrative   Not on file   Social Determinants of Health   Financial Resource Strain: Not on file  Food Insecurity: Not on file  Transportation Needs: Not on file  Physical Activity: Not on file  Stress: Not on file  Social Connections: Not on file    Tobacco Counseling Counseling given: Not Answered   Clinical Intake:              How often do you need to have someone help you when you read instructions, pamphlets, or other written materials from your doctor or pharmacy?: (P) 1 - Never         Activities of Daily Living    03/15/2023   10:32 AM  In your present state of health, do you have any difficulty performing the following activities:  Hearing? 0  Vision? 0  Difficulty  concentrating or making decisions? 0  Walking or climbing stairs? 0  Dressing or bathing? 0  Doing errands, shopping? 0  Preparing Food and eating ? N  Using the Toilet? N  In the past six months, have you accidently leaked urine? N  Do you have problems with loss of bowel control? N  Managing your Medications? N  Managing your Finances? N  Housekeeping or managing your Housekeeping? N    Patient Care Team: Donita Brooks, MD as PCP - General (Family Medicine)  Indicate any recent Medical Services you may have received from other than [facility] providers in the past year (date may be approximate).     Assessment:   This is a routine wellness examination for Joseph Tucker.  Hearing/Vision screen No results found.  Dietary issues and exercise activities discussed:     Goals Addressed   None    Depression Screen    10/17/2022    8:30 AM 10/13/2021    8:29 AM 09/14/2020    9:24 AM 10/03/2019    8:17 AM 03/27/2018    8:15 AM 01/13/2014    8:31 AM  PHQ 2/9 Scores  PHQ - 2 Score 0 0 0 0 1 0  PHQ- 9 Score  0        Fall Risk    03/15/2023   10:32 AM 10/17/2022    8:30 AM 10/13/2021    8:29 AM 09/14/2020    9:24 AM 03/27/2018    8:15 AM  Fall Risk   Falls in the past year? 0 0 0 0 No  Number falls in past yr: 0 0 0 0   Injury with Fall? 0 0 0 0   Risk for fall due to :  No Fall Risks     Follow up  Falls prevention discussed       MEDICARE RISK AT HOME:   TIMED UP AND GO:  Was the test performed?  No    Cognitive Function:        Immunizations Immunization History  Administered Date(s) Administered   PNEUMOCOCCAL CONJUGATE-20 10/13/2021   Pneumococcal Polysaccharide-23 10/08/2020   Td 04/17/2001   Tdap 09/19/2013   Zoster, Live 07/16/2015    TDAP status: Up to date  Flu Vaccine status: Due, Education has been provided regarding the importance of this vaccine. Advised may receive this vaccine at local pharmacy or Health Dept. Aware to provide a copy of the  vaccination record if obtained from  local pharmacy or Health Dept. Verbalized acceptance and understanding.  Pneumococcal vaccine status: Up to date  Covid-19 vaccine status: Information provided on how to obtain vaccines.   Qualifies for Shingles Vaccine? Yes   Zostavax completed No   Shingrix Completed?: No.    Education has been provided regarding the importance of this vaccine. Patient has been advised to call insurance company to determine out of pocket expense if they have not yet received this vaccine. Advised may also receive vaccine at local pharmacy or Health Dept. Verbalized acceptance and understanding.  Screening Tests Health Maintenance  Topic Date Due   Zoster Vaccines- Shingrix (1 of 2) 03/23/2023 (Originally 02/04/2005)   Hepatitis C Screening  12/21/2023 (Originally 02[birth year]04/1973)   HEMOGLOBIN A1C  04/14/2023   DTaP/Tdap/Td (3 - Td or Tdap) 09/20/2023   Diabetic kidney evaluation - eGFR measurement  10/12/2023   Diabetic kidney evaluation - Urine ACR  10/12/2023   FOOT EXAM  10/17/2023   Medicare Annual Wellness (AWV)  10/17/2023   OPHTHALMOLOGY EXAM  11/07/2023   Colonoscopy  01/25/2026   Pneumonia Vaccine 59+ Years old  Completed   HPV VACCINES  Aged Out   INFLUENZA VACCINE  Discontinued   COVID-19 Vaccine  Discontinued    Health Maintenance  There are no preventive care reminders to display for this patient.  Colorectal cancer screening: Type of screening: Colonoscopy. Completed 01/25/21. Repeat every 5 years  Lung Cancer Screening: (Low Dose CT Chest recommended if Age 90-80 years, 20 pack-year currently smoking OR have quit w/in 15years.) does not qualify.   Lung Cancer Screening Referral: n/a  Additional Screening:  Hepatitis C Screening: does qualify; Completed ***  Vision Screening: Recommended annual ophthalmology exams for early detection of glaucoma and other disorders of the eye. Is the patient up to date with their annual eye exam?   {YES/NO:21197} Who is the provider or what is the name of the office in which the patient attends annual eye exams? *** If pt is not established with a provider, would they like to be referred to a provider to establish care? {YES/NO:21197}.   Dental Screening: Recommended annual dental exams for proper oral hygiene  Diabetic Foot Exam: Diabetic Foot Exam: Completed 10/17/22  Community Resource Referral / Chronic Care Management: CRR required this visit?  {YES/NO:21197}  CCM required this visit?  {CCM Required choices:[phone]}     Plan:     I have personally reviewed and noted the following in the patient's chart:   Medical and social history Use of alcohol, tobacco or illicit drugs  Current medications and supplements including opioid prescriptions. {Opioid [phone]} Functional ability and status Nutritional status Physical activity Advanced directives List of other physicians Hospitalizations, surgeries, and ER visits in previous 12 months Vitals Screenings to include cognitive, depression, and falls Referrals and appointments  In addition, I have reviewed and discussed with patient certain preventive protocols, quality metrics, and best practice recommendations. A written personalized care plan for preventive services as well as general preventive health recommendations were provided to patient.     Kandis Fantasia [place], [place]   0/04/8118   After Visit Summary: {CHL AMB AWV After Visit Summary:[phone]}  Nurse Notes: ***

## 2023-03-15 NOTE — Patient Instructions (Incomplete)
Joseph Tucker , Thank you for taking time to come for your Medicare Wellness Visit. I appreciate your ongoing commitment to your health goals. Please review the following plan we discussed and let me know if I can assist you in the future.   Referrals/Orders/Follow-Ups/Clinician Recommendations: Aim for 30 minutes of exercise or brisk walking, 6-8 glasses of water, and 5 servings of fruits and vegetables each day.  This is a list of the screening recommended for you and due dates:  Health Maintenance  Topic Date Due   Zoster (Shingles) Vaccine (1 of 2) 03/23/2023*   Hepatitis C Screening  12/21/2023*   Hemoglobin A1C  04/14/2023   DTaP/Tdap/Td vaccine (3 - Td or Tdap) 09/20/2023   Yearly kidney function blood test for diabetes  10/12/2023   Yearly kidney health urinalysis for diabetes  10/12/2023   Complete foot exam   10/17/2023   Medicare Annual Wellness Visit  10/17/2023   Eye exam for diabetics  11/07/2023   Colon Cancer Screening  01/25/2026   Pneumonia Vaccine  Completed   HPV Vaccine  Aged Out   Flu Shot  Discontinued   COVID-19 Vaccine  Discontinued  *Topic was postponed. The date shown is not the original due date.    Advanced directives: (ACP Link)Information on Advanced Care Planning can be found at [facility] of [facility] Advance Health Care Directives Advance Health Care Directives ([email or web address]   Next Medicare Annual Wellness Visit scheduled for next year: Yes  Preventive Care 65 Years and Older, Male  Preventive care refers to lifestyle choices and visits with your health care provider that can promote health and wellness. What does preventive care include? A yearly physical exam. This is also called an annual well check. Dental exams once or twice a year. Routine eye exams. Ask your health care provider how often you should have your eyes checked. Personal lifestyle choices, including: Daily care of your teeth and gums. Regular physical activity. Eating a  healthy diet. Avoiding tobacco and drug use. Limiting alcohol use. Practicing safe sex. Taking low doses of aspirin every day. Taking vitamin and mineral supplements as recommended by your health care provider. What happens during an annual well check? The services and screenings done by your health care provider during your annual well check will depend on your age, overall health, lifestyle risk factors, and family history of disease. Counseling  Your health care provider may ask you questions about your: Alcohol use. Tobacco use. Drug use. Emotional well-being. Home and relationship well-being. Sexual activity. Eating habits. History of falls. Memory and ability to understand (cognition). Work and work Astronomer. Screening  You may have the following tests or measurements: Height, weight, and BMI. Blood pressure. Lipid and cholesterol levels. These may be checked every 5 years, or more frequently if you are over 44 years old. Skin check. Lung cancer screening. You may have this screening every year starting at age 52 if you have a 30-pack-year history of smoking and currently smoke or have quit within the past 15 years. Fecal occult blood test (FOBT) of the stool. You may have this test every year starting at age 33. Flexible sigmoidoscopy or colonoscopy. You may have a sigmoidoscopy every 5 years or a colonoscopy every 10 years starting at age 20. Prostate cancer screening. Recommendations will vary depending on your family history and other risks. Hepatitis C blood test. Hepatitis B blood test. Sexually transmitted disease (STD) testing. Diabetes screening. This is done by checking your blood sugar (glucose) after  you have not eaten for a while (fasting). You may have this done every 1-3 years. Abdominal aortic aneurysm (AAA) screening. You may need this if you are a current or former smoker. Osteoporosis. You may be screened starting at age 68 if you are at high risk. Talk  with your health care provider about your test results, treatment options, and if necessary, the need for more tests. Vaccines  Your health care provider may recommend certain vaccines, such as: Influenza vaccine. This is recommended every year. Tetanus, diphtheria, and acellular pertussis (Tdap, Td) vaccine. You may need a Td booster every 10 years. Zoster vaccine. You may need this after age 25. Pneumococcal 13-valent conjugate (PCV13) vaccine. One dose is recommended after age 66. Pneumococcal polysaccharide (PPSV23) vaccine. One dose is recommended after age 24. Talk to your health care provider about which screenings and vaccines you need and how often you need them. This information is not intended to replace advice given to you by your health care provider. Make sure you discuss any questions you have with your health care provider. Document Released: 08/21/2015 Document Revised: 04/13/2016 Document Reviewed: 05/26/2015 Elsevier Interactive Patient Education  2017 [employer].  Fall Prevention in the Home Falls can cause injuries. They can happen to people of all ages. There are many things you can do to make your home safe and to help prevent falls. What can I do on the outside of my home? Regularly fix the edges of walkways and driveways and fix any cracks. Remove anything that might make you trip as you walk through a door, such as a raised step or threshold. Trim any bushes or trees on the path to your home. Use bright outdoor lighting. Clear any walking paths of anything that might make someone trip, such as rocks or tools. Regularly check to see if handrails are loose or broken. Make sure that both sides of any steps have handrails. Any raised decks and porches should have guardrails on the edges. Have any leaves, snow, or ice cleared regularly. Use sand or salt on walking paths during winter. Clean up any spills in your garage right away. This includes oil or grease  spills. What can I do in the bathroom? Use night lights. Install grab bars by the toilet and in the tub and shower. Do not use towel bars as grab bars. Use non-skid mats or decals in the tub or shower. If you need to sit down in the shower, use a plastic, non-slip stool. Keep the floor dry. Clean up any water that spills on the floor as soon as it happens. Remove soap buildup in the tub or shower regularly. Attach bath mats securely with double-sided non-slip rug tape. Do not have throw rugs and other things on the floor that can make you trip. What can I do in the bedroom? Use night lights. Make sure that you have a light by your bed that is easy to reach. Do not use any sheets or blankets that are too big for your bed. They should not hang down onto the floor. Have a firm chair that has side arms. You can use this for support while you get dressed. Do not have throw rugs and other things on the floor that can make you trip. What can I do in the kitchen? Clean up any spills right away. Avoid walking on wet floors. Keep items that you use a lot in easy-to-reach places. If you need to reach something above you, use a strong  step stool that has a grab bar. Keep electrical cords out of the way. Do not use floor polish or wax that makes floors slippery. If you must use wax, use non-skid floor wax. Do not have throw rugs and other things on the floor that can make you trip. What can I do with my stairs? Do not leave any items on the stairs. Make sure that there are handrails on both sides of the stairs and use them. Fix handrails that are broken or loose. Make sure that handrails are as long as the stairways. Check any carpeting to make sure that it is firmly attached to the stairs. Fix any carpet that is loose or worn. Avoid having throw rugs at the top or bottom of the stairs. If you do have throw rugs, attach them to the floor with carpet tape. Make sure that you have a light switch at the  top of the stairs and the bottom of the stairs. If you do not have them, ask someone to add them for you. What else can I do to help prevent falls? Wear shoes that: Do not have high heels. Have rubber bottoms. Are comfortable and fit you well. Are closed at the toe. Do not wear sandals. If you use a stepladder: Make sure that it is fully opened. Do not climb a closed stepladder. Make sure that both sides of the stepladder are locked into place. Ask someone to hold it for you, if possible. Clearly mark and make sure that you can see: Any grab bars or handrails. First and last steps. Where the edge of each step is. Use tools that help you move around (mobility aids) if they are needed. These include: Canes. Walkers. Scooters. Crutches. Turn on the lights when you go into a dark area. Replace any light bulbs as soon as they burn out. Set up your furniture so you have a clear path. Avoid moving your furniture around. If any of your floors are uneven, fix them. If there are any pets around you, be aware of where they are. Review your medicines with your doctor. Some medicines can make you feel dizzy. This can increase your chance of falling. Ask your doctor what other things that you can do to help prevent falls. This information is not intended to replace advice given to you by your health care provider. Make sure you discuss any questions you have with your health care provider. Document Released: 05/21/2009 Document Revised: 12/31/2015 Document Reviewed: 08/29/2014 Elsevier Interactive Patient Education  2017 [employer].

## 2023-03-16 ENCOUNTER — Ambulatory Visit (INDEPENDENT_AMBULATORY_CARE_PROVIDER_SITE_OTHER): Payer: PPO

## 2023-03-16 VITALS — Ht 70.0 in | Wt 195.0 lb

## 2023-03-16 DIAGNOSIS — Z0000 Encounter for general adult medical examination without abnormal findings: Secondary | ICD-10-CM

## 2023-03-16 MED ORDER — GABAPENTIN 300 MG PO CAPS: 300.0000 mg | ORAL_CAPSULE | Freq: Every day | ORAL | 0 refills | Status: DC

## 2023-05-15 ENCOUNTER — Other Ambulatory Visit: Payer: Self-pay | Admitting: Family Medicine

## 2023-05-16 ENCOUNTER — Other Ambulatory Visit: Payer: Self-pay | Admitting: Family Medicine

## 2023-05-16 NOTE — Telephone Encounter (Signed)
Requested Prescriptions  Pending Prescriptions Disp Refills   gabapentin (NEURONTIN) 300 MG capsule [Pharmacy Med Name: GABAPENTIN 300 MG CAPSULE] 90 capsule 0    Sig: TAKE 1 CAPSULE BY MOUTH AT BEDTIME; **MUST CALL MD FOR APPOINTMENT FOR FUTURE REFILLS     Neurology: Anticonvulsants - gabapentin Failed - 05/16/2023  6:11 AM      Failed - Valid encounter within last 12 months    Recent Outpatient Visits           1 year ago General medical exam   [facility] Donita Brooks, MD   2 years ago Colon cancer screening   [facility] Donita Brooks, MD   2 years ago Cough   [facility] Donita Brooks, MD   3 years ago Controlled type 2 diabetes mellitus with complication, without long-term current use of insulin (HCC)   [facility] Medicine Pickard, Priscille Heidelberg, MD   5 years ago Chronic fatigue   [facility] Medicine Pickard, Priscille Heidelberg, MD              Passed - Cr in normal range and within 360 days    Creat  Date Value Ref Range Status  10/12/2022 1.12 0.70 - 1.35 mg/dL Final   Creatinine, Urine  Date Value Ref Range Status  10/12/2022 137 20 - 320 mg/dL Final         Passed - Completed PHQ-2 or PHQ-9 in the last 360 days

## 2023-05-16 NOTE — Telephone Encounter (Signed)
Requested medication (s) are due for refill today: yes  Requested medication (s) are on the active medication list: yes  Last refill:  02/13/23 #90   Future visit scheduled: yes  Notes to clinic:  no   Requested Prescriptions  Pending Prescriptions Disp Refills   allopurinol (ZYLOPRIM) 300 MG tablet [Pharmacy Med Name: ALLOPURINOL 300 MG TABLET] 90 tablet 0    Sig: TAKE 1 TABLET BY MOUTH DAILY     Endocrinology:  Gout Agents - allopurinol Failed - 05/15/2023  3:57 PM      Failed - Uric Acid in normal range and within 360 days    Uric Acid, Serum  Date Value Ref Range Status  10/03/2019 8.3 (H) 4.0 - 8.0 mg/dL Final    Comment:    Therapeutic target for gout patients: <6.0 mg/dL .          Failed - Valid encounter within last 12 months    Recent Outpatient Visits           1 year ago General medical exam   [facility] Donita Brooks, MD   2 years ago Colon cancer screening   [facility] Donita Brooks, MD   2 years ago Cough   [facility] Tanya Nones, Priscille Heidelberg, MD   3 years ago Controlled type 2 diabetes mellitus with complication, without long-term current use of insulin (HCC)   [facility] Medicine Pickard, Priscille Heidelberg, MD   5 years ago Chronic fatigue   [facility] Medicine Pickard, Priscille Heidelberg, MD              Passed - Cr in normal range and within 360 days    Creat  Date Value Ref Range Status  10/12/2022 1.12 0.70 - 1.35 mg/dL Final   Creatinine, Urine  Date Value Ref Range Status  10/12/2022 137 20 - 320 mg/dL Final         Passed - CBC within normal limits and completed in the last 12 months    WBC  Date Value Ref Range Status  10/12/2022 5.6 3.8 - 10.8 Thousand/uL Final   RBC  Date Value Ref Range Status  10/12/2022 4.95 4.20 - 5.80 Million/uL Final   Hemoglobin  Date Value Ref Range Status  10/12/2022 14.7 13.2 - 17.1 g/dL Final   HCT  Date Value Ref Range Status  10/12/2022  44.6 38.5 - 50.0 % Final   MCHC  Date Value Ref Range Status  10/12/2022 33.0 32.0 - 36.0 g/dL Final   [facility]  Date Value Ref Range Status  10/12/2022 29.7 27.0 - 33.0 pg Final   MCV  Date Value Ref Range Status  10/12/2022 90.1 80.0 - 100.0 fL Final   No results found for: "PLTCOUNTKUC", "LABPLAT", "POCPLA" RDW  Date Value Ref Range Status  10/12/2022 13.4 11.0 - 15.0 % Final

## 2023-05-22 ENCOUNTER — Other Ambulatory Visit: Payer: Self-pay | Admitting: Family Medicine

## 2023-05-25 DIAGNOSIS — L821 Other seborrheic keratosis: Secondary | ICD-10-CM | POA: Diagnosis not present

## 2023-05-25 DIAGNOSIS — D225 Melanocytic nevi of trunk: Secondary | ICD-10-CM | POA: Diagnosis not present

## 2023-05-25 DIAGNOSIS — D2272 Melanocytic nevi of left lower limb, including hip: Secondary | ICD-10-CM | POA: Diagnosis not present

## 2023-05-25 DIAGNOSIS — L570 Actinic keratosis: Secondary | ICD-10-CM | POA: Diagnosis not present

## 2023-05-25 DIAGNOSIS — Z85828 Personal history of other malignant neoplasm of skin: Secondary | ICD-10-CM | POA: Diagnosis not present

## 2023-05-25 DIAGNOSIS — L720 Epidermal cyst: Secondary | ICD-10-CM | POA: Diagnosis not present

## 2023-07-11 ENCOUNTER — Other Ambulatory Visit: Payer: Self-pay

## 2023-07-11 MED ORDER — LOSARTAN POTASSIUM-HCTZ 100-12.5 MG PO TABS: 1.0000 | ORAL_TABLET | Freq: Every day | ORAL | 3 refills | Status: DC

## 2023-08-03 ENCOUNTER — Other Ambulatory Visit: Payer: Self-pay | Admitting: Family Medicine

## 2023-08-03 DIAGNOSIS — E7800 Pure hypercholesterolemia, unspecified: Secondary | ICD-10-CM

## 2023-08-04 ENCOUNTER — Other Ambulatory Visit: Payer: Self-pay | Admitting: Family Medicine

## 2023-08-04 DIAGNOSIS — E7800 Pure hypercholesterolemia, unspecified: Secondary | ICD-10-CM

## 2023-08-04 NOTE — Telephone Encounter (Signed)
Requested Prescriptions  Pending Prescriptions Disp Refills   pravastatin (PRAVACHOL) 40 MG tablet [Pharmacy Med Name: PRAVASTATIN SODIUM 40 MG TAB] 90 tablet 0    Sig: TAKE 1 TABLET BY MOUTH DAILY     Cardiovascular:  Antilipid - Statins Failed - 08/04/2023 12:19 PM      Failed - Valid encounter within last 12 months    Recent Outpatient Visits           1 year ago General medical exam   [facility] Donita Brooks, MD   2 years ago Colon cancer screening   [facility] Donita Brooks, MD   2 years ago Cough   [facility] Donita Brooks, MD   3 years ago Controlled type 2 diabetes mellitus with complication, without long-term current use of insulin (HCC)   [facility] Medicine Pickard, Priscille Heidelberg, MD   5 years ago Chronic fatigue   [facility] Medicine Tanya Nones, Priscille Heidelberg, MD              Failed - Lipid Panel in normal range within the last 12 months    Cholesterol  Date Value Ref Range Status  10/12/2022 148 <200 mg/dL Final   LDL Cholesterol (Calc)  Date Value Ref Range Status  10/12/2022 80 mg/dL (calc) Final    Comment:    Reference range: <100 . Desirable range <100 mg/dL for primary prevention;   <70 mg/dL for patients with CHD or diabetic patients  with > or = 2 CHD risk factors. Marland Kitchen LDL-C is now calculated using the Martin-Hopkins  calculation, which is a validated novel method providing  better accuracy than the Friedewald equation in the  estimation of LDL-C.  Horald Pollen et al. Lenox Ahr. 1610;960(45): 2061-2068  (http://education.[employer].com/faq/FAQ164)    HDL  Date Value Ref Range Status  10/12/2022 53 > OR = 40 mg/dL Final   Triglycerides  Date Value Ref Range Status  10/12/2022 71 <150 mg/dL Final         Passed - Patient is not pregnant

## 2023-08-07 MED ORDER — PRAVASTATIN SODIUM 40 MG PO TABS
40.0000 mg | ORAL_TABLET | Freq: Every day | ORAL | 1 refills | Status: DC
Start: 2023-08-07 — End: 2024-05-02

## 2023-08-22 DIAGNOSIS — H2513 Age-related nuclear cataract, bilateral: Secondary | ICD-10-CM | POA: Diagnosis not present

## 2023-08-22 DIAGNOSIS — E119 Type 2 diabetes mellitus without complications: Secondary | ICD-10-CM | POA: Diagnosis not present

## 2023-09-21 ENCOUNTER — Other Ambulatory Visit: Payer: Self-pay | Admitting: Family Medicine

## 2023-09-21 MED ORDER — ALLOPURINOL 300 MG PO TABS: 300.0000 mg | ORAL_TABLET | Freq: Every day | ORAL | 3 refills | Status: AC

## 2023-09-26 ENCOUNTER — Other Ambulatory Visit: Payer: Self-pay | Admitting: Family Medicine

## 2023-09-26 ENCOUNTER — Encounter: Payer: Self-pay | Admitting: Family Medicine

## 2023-09-26 MED ORDER — HYDROCODONE BIT-HOMATROP MBR 5-1.5 MG/5ML PO SOLN: 5.0000 mL | Freq: Four times a day (QID) | ORAL | 0 refills | Status: AC | PRN

## 2023-10-19 ENCOUNTER — Encounter: Payer: Self-pay | Admitting: Family Medicine

## 2023-10-19 ENCOUNTER — Ambulatory Visit (INDEPENDENT_AMBULATORY_CARE_PROVIDER_SITE_OTHER): Payer: PPO | Admitting: Family Medicine

## 2023-10-19 VITALS — BP 138/78 | HR 65 | Temp 98.7°F | Ht 70.0 in | Wt 198.4 lb

## 2023-10-19 DIAGNOSIS — Z125 Encounter for screening for malignant neoplasm of prostate: Secondary | ICD-10-CM | POA: Diagnosis not present

## 2023-10-19 DIAGNOSIS — Z0000 Encounter for general adult medical examination without abnormal findings: Secondary | ICD-10-CM

## 2023-10-19 DIAGNOSIS — I10 Essential (primary) hypertension: Secondary | ICD-10-CM | POA: Diagnosis not present

## 2023-10-19 DIAGNOSIS — E7800 Pure hypercholesterolemia, unspecified: Secondary | ICD-10-CM

## 2023-10-19 DIAGNOSIS — Z0001 Encounter for general adult medical examination with abnormal findings: Secondary | ICD-10-CM | POA: Diagnosis not present

## 2023-10-19 DIAGNOSIS — E118 Type 2 diabetes mellitus with unspecified complications: Secondary | ICD-10-CM | POA: Diagnosis not present

## 2023-10-19 MED ORDER — CELECOXIB 200 MG PO CAPS: 200.0000 mg | ORAL_CAPSULE | Freq: Every day | ORAL | 11 refills | Status: DC

## 2023-10-19 MED ORDER — PREDNISONE 20 MG PO TABS: ORAL_TABLET | ORAL | 0 refills | Status: AC

## 2023-10-19 MED ORDER — TRAZODONE HCL 50 MG PO TABS: 50.0000 mg | ORAL_TABLET | Freq: Every day | ORAL | 3 refills | Status: AC

## 2023-10-19 NOTE — Progress Notes (Addendum)
 Subjective:   Joseph Tucker is a 68 y.o. male who presents for Medicare Annual/Subsequent preventive examination.  Visit Complete: In person  Patient Medicare AWV questionnaire was completed by the patient on 10/19/2023; I have confirmed that all information answered by patient is correct and no changes since this date.  Cardiac Risk Factors include: advanced age (>15men, >41 women);hypertension;male gender;obesity (BMI >30kg/m2);sedentary lifestyle     Objective:    Today's Vitals   10/19/23 0820  BP: 138/78  Pulse: 65  Temp: 98.7 F (37.1 C)  SpO2: 99%  Weight: 198 lb 6.4 oz (90 kg)  Height: 5\' 10"  (1.778 m)   Body mass index is 28.47 kg/m.     10/19/2023    8:31 AM 03/16/2023    1:14 PM 01/14/2016    7:59 AM  Advanced Directives  Does Patient Have a Medical Advance Directive? Yes No No  Type of Estate agent of [place];Living will    Copy of Healthcare Power of Attorney in Chart? No - copy requested    Would patient like information on creating a medical advance directive?  Yes (MAU/Ambulatory/Procedural Areas - Information given) No - patient declined information    Current Medications (verified) Outpatient Encounter Medications as of 10/19/2023  Medication Sig   allopurinol (ZYLOPRIM) 300 MG tablet Take 1 tablet (300 mg total) by mouth daily.   celecoxib (CELEBREX) 200 MG capsule Take 1 capsule (200 mg total) by mouth daily.   Cholecalciferol (VITAMIN D3 PO) Take by mouth.   Fexofenadine HCl (ALLEGRA PO) Take by mouth 1 day or 1 dose.   gabapentin (NEURONTIN) 300 MG capsule TAKE 1 CAPSULE BY MOUTH AT BEDTIME; **MUST CALL MD FOR APPOINTMENT FOR FUTURE REFILLS   HYDROcodone bit-homatropine (HYDROMET) 5-1.5 MG/5ML syrup Take 5 mLs by mouth every 6 (six) hours as needed for cough. (Patient not taking: Reported on 10/19/2023)   losartan-hydrochlorothiazide (HYZAAR) 100-12.5 MG tablet Take 1 tablet by mouth daily. TAKE 1 TABLET BY MOUTH DAILY    pravastatin (PRAVACHOL) 40 MG tablet Take 1 tablet (40 mg total) by mouth daily.   predniSONE (DELTASONE) 20 MG tablet 3 tabs poqday 1-2, 2 tabs poqday 3-4, 1 tab poqday 5-6   traZODone (DESYREL) 50 MG tablet Take 1 tablet (50 mg total) by mouth at bedtime.   vitamin B-12 (CYANOCOBALAMIN) 1000 MCG tablet Take 1,000 mcg by mouth daily.   colchicine 0.6 MG tablet Take 1 tablet (0.6 mg total) by mouth daily. Take as directed in message (Patient not taking: Reported on 10/19/2023)   MAGNESIUM PO Take by mouth. (Patient not taking: Reported on 10/19/2023)   No facility-administered encounter medications on file as of 10/19/2023.    Allergies (verified) Codeine and Penicillins   History: Past Medical History:  Diagnosis Date   Colon polyps    Diabetes mellitus without complication (HCC)    GERD (gastroesophageal reflux disease)    Hyperlipidemia    Hypertension    Kidney stones    Pneumonia    Prediabetes    Past Surgical History:  Procedure Laterality Date   ARTHROSCOPIC REPAIR ACL     CERVICAL SPINE SURGERY     [facility]  11/2010   [facility]   HERNIA REPAIR  1979   KNEE SURGERY     RIGHT KNEE   LITHOTRIPSY  08 & 09   NECK SURGERY     Family History  Problem Relation Age of Onset   Colon cancer Father 57   Colon polyps Father  Heart disease Father    Cancer Father        colon   Stomach cancer Mother    Cancer Mother        brain   Leukemia Sister    Cancer Sister        leukemia   Esophageal cancer Neg Hx    Rectal cancer Neg Hx    Social History   Socioeconomic History   Marital status: Married    Spouse name: Not on file   Number of children: Not on file   Years of education: Not on file   Highest education level: Not on file  Occupational History   Occupation: lineman  Tobacco Use   Smoking status: Never   Smokeless tobacco: Never  Vaping Use   Vaping status: Never Used  Substance and Sexual Activity   Alcohol use: No   Drug use: No   Sexual  activity: Yes    Comment: married, owns an Charity fundraiser  Other Topics Concern   Not on file  Social History Narrative   Not on file   Social Drivers of Health   Financial Resource Strain: Low Risk  (10/19/2023)   Overall Financial Resource Strain (CARDIA)    Difficulty of Paying Living Expenses: Not very hard  Food Insecurity: No Food Insecurity (10/19/2023)   Hunger Vital Sign    Worried About Running Out of Food in the Last Year: Never true    Ran Out of Food in the Last Year: Never true  Transportation Needs: No Transportation Needs (10/19/2023)   PRAPARE - Administrator, Civil Service (Medical): No    Lack of Transportation (Non-Medical): No  Physical Activity: Sufficiently Active (10/19/2023)   Exercise Vital Sign    Days of Exercise per Week: 7 days    Minutes of Exercise per Session: 30 min  Stress: No Stress Concern Present (10/19/2023)   [employer] of Occupational Health - Occupational Stress Questionnaire    Feeling of Stress : Not at all  Social Connections: Socially Integrated (10/19/2023)   Social Connection and Isolation Panel [NHANES]    Frequency of Communication with Friends and Family: More than three times a week    Frequency of Social Gatherings with Friends and Family: More than three times a week    Attends Religious Services: More than 4 times per year    Active Member of [employer] or Organizations: Yes    Attends Engineer, structural: More than 4 times per year    Marital Status: Married    Tobacco Counseling Counseling given: Not Answered   Clinical Intake:  Pre-visit preparation completed: Yes  Pain : No/denies pain     BMI - recorded: 28.47 Nutritional Status: BMI 25 -29 Overweight Nutritional Risks: None Diabetes: No  How often do you need to have someone help you when you read instructions, pamphlets, or other written materials from your doctor or pharmacy?: 1 - Never  Interpreter Needed?:  No  Information entered by :: [email or web address], lpn   Activities of Daily Living    10/19/2023    8:32 AM 03/15/2023   10:32 AM  In your present state of health, do you have any difficulty performing the following activities:  Hearing? 0 0  Vision? 0 0  Difficulty concentrating or making decisions? 0 0  Walking or climbing stairs? 0 0  Dressing or bathing? 0 0  Doing errands, shopping? 0 0  Preparing Food and eating ? N N  Using the  Toilet? N N  In the past six months, have you accidently leaked urine? N N  Do you have problems with loss of bowel control? N N  Managing your Medications? N N  Managing your Finances? N N  Housekeeping or managing your Housekeeping? N N    Patient Care Team: Donita Brooks, MD as PCP - General (Family Medicine)  Indicate any recent Medical Services you may have received from other than [facility] providers in the past year (date may be approximate).     Assessment:   This is a routine wellness examination for Wagner.  Hearing/Vision screen Hearing Screening - Comments:: No hearing issues.  Vision Screening - Comments:: No issues.   Goals Addressed             This Visit's Progress    Remain active and independent       Continue.        Depression Screen    10/19/2023    8:29 AM 03/16/2023    1:13 PM 10/17/2022    8:30 AM 10/13/2021    8:29 AM 09/14/2020    9:24 AM 10/03/2019    8:17 AM 03/27/2018    8:15 AM  PHQ 2/9 Scores  PHQ - 2 Score 0 0 0 0 0 0 1  PHQ- 9 Score    0       Fall Risk    10/19/2023    8:32 AM 03/15/2023   10:32 AM 10/17/2022    8:30 AM 10/13/2021    8:29 AM 09/14/2020    9:24 AM  Fall Risk   Falls in the past year? 0 0 0 0 0  Number falls in past yr: 0 0 0 0 0  Injury with Fall? 0 0 0 0 0  Risk for fall due to : No Fall Risks No Fall Risks No Fall Risks    Follow up Falls prevention discussed;Falls evaluation completed Education provided;Falls prevention discussed;Falls evaluation completed Falls prevention discussed       MEDICARE RISK AT HOME:    TIMED UP AND GO:  Was the test performed?  Yes  Length of time to ambulate 10 feet: 98 sec Gait steady and fast without use of assistive device    Cognitive Function:        10/19/2023    8:32 AM 03/16/2023    1:16 PM  6CIT Screen  What Year? 0 points 0 points  What month? 0 points 0 points  What time? 0 points 0 points  Count back from 20 0 points 0 points  Months in reverse 0 points 0 points  Repeat phrase 0 points 0 points  Total Score 0 points 0 points    Immunizations Immunization History  Administered Date(s) Administered   PNEUMOCOCCAL CONJUGATE-20 10/13/2021   Pneumococcal Polysaccharide-23 10/08/2020   Td 04/17/2001   Tdap 09/19/2013   Zoster, Live 07/16/2015    TDAP status: Due, Education has been provided regarding the importance of this vaccine. Advised may receive this vaccine at local pharmacy or Health Dept. Aware to provide a copy of the vaccination record if obtained from local pharmacy or Health Dept. Verbalized acceptance and understanding.  Flu Vaccine status: Up to date  Pneumococcal vaccine status: Up to date  Covid-19 vaccine status: Completed vaccines  Qualifies for Shingles Vaccine? Yes   Zostavax completed Yes   Shingrix Completed?: No.    Education has been provided regarding the importance of this vaccine. Patient has been advised to call insurance company  to determine out of pocket expense if they have not yet received this vaccine. Advised may also receive vaccine at local pharmacy or Health Dept. Verbalized acceptance and understanding.  Screening Tests Health Maintenance  Topic Date Due   Hepatitis C Screening  12/21/2023 (Originally 02/04/1974)   Zoster Vaccines- Shingrix (1 of 2) 01/19/2024 (Originally 02/04/2005)   DTaP/Tdap/Td (3 - Td or Tdap) 10/18/2024 (Originally 09/20/2023)   OPHTHALMOLOGY EXAM  11/07/2023   HEMOGLOBIN A1C  04/20/2024   Diabetic kidney evaluation - eGFR measurement  10/18/2024    Diabetic kidney evaluation - Urine ACR  10/18/2024   FOOT EXAM  10/18/2024   Medicare Annual Wellness (AWV)  10/18/2024   Colonoscopy  01/25/2026   Pneumonia Vaccine 52+ Years old  Completed   HPV VACCINES  Aged Out   INFLUENZA VACCINE  Discontinued   COVID-19 Vaccine  Discontinued    Health Maintenance  There are no preventive care reminders to display for this patient.   Colorectal cancer screening: Type of screening: Colonoscopy. Completed 01/25/2021. Repeat every 5 years  Lung Cancer Screening: (Low Dose CT Chest recommended if Age 56-80 years, 20 pack-year currently smoking OR have quit w/in 15years.) does not qualify.   Lung Cancer Screening Referral: N/A  Additional Screening:  Hepatitis C Screening: does qualify; Completed POSTPONED  Vision Screening: Recommended annual ophthalmology exams for early detection of glaucoma and other disorders of the eye. Is the patient up to date with their annual eye exam?  Yes  Who is the provider or what is the name of the office in which the patient attends annual eye exams? [facility]  If pt is not established with a provider, would they like to be referred to a provider to establish care? No .   Dental Screening: Recommended annual dental exams for proper oral hygiene  Diabetic Foot Exam: Diabetic Foot Exam: Completed 10/19/2023  Community Resource Referral / Chronic Care Management: CRR required this visit?  No   CCM required this visit?  No     Plan:     I have personally reviewed and noted the following in the patient's chart:   Medical and social history Use of alcohol, tobacco or illicit drugs  Current medications and supplements including opioid prescriptions. Patient is not currently taking opioid prescriptions. Functional ability and status Nutritional status Physical activity Advanced directives List of other physicians Hospitalizations, surgeries, and ER visits in previous 12 months Vitals Screenings to  include cognitive, depression, and falls Referrals and appointments  In addition, I have reviewed and discussed with patient certain preventive protocols, quality metrics, and best practice recommendations. A written personalized care plan for preventive services as well as general preventive health recommendations were provided to patient.     Darral Dash, LPN   [id number]/82/9562   After Visit Summary: (In Person-Printed) AVS printed and given to the patient  Patient's colonoscopy was performed in 2022.  He is due for this again in 2027.  I will check a PSA today to screen for prostate cancer.  His blood pressure is excellent.  I will check a CBC a CMP lipid panel and an A1c.  I like to see his A1c less than 6.5 and his LDL cholesterol less than 130.  I will also check a urine protein creatinine ratio to evaluate for diabetic nephropathy.  Recommended the shingles vaccine as well as a tetanus shot.  However the patient is having left-sided cervical radiculopathy.  Therefore we will go to try out prednisone taper  pack.  Once he finishes the prednisone taper pack he can return for his vaccine.  He has been taking Aleve every day for the last 2 to 3 years for wrist pain and joint pain.  I recommended switching to Celebrex the less toxic to stomach.  Patient will require trazodone 50 mg p.o. nightly to help with insomnia.  I wrote him a prescription for this.  Regular anticipatory guidance is provided physical exam was performed today as well and was normal.  Diabetic foot exam was normal. I have collaborated with the care management provider regarding care management and care coordination activities outlined in this encounter and have reviewed this encounter including documentation in the note and care plan. I am certifying that I agree with the content of this note and encounter as supervising physician.

## 2023-10-20 LAB — COMPLETE METABOLIC PANEL WITH GFR
AG Ratio: 2.2 (calc) (ref 1.0–2.5)
ALT: 25 U/L (ref 9–46)
AST: 20 U/L (ref 10–35)
Albumin: 5 g/dL (ref 3.6–5.1)
Alkaline phosphatase (APISO): 47 U/L (ref 35–144)
BUN: 18 mg/dL (ref 7–25)
CO2: 26 mmol/L (ref 20–32)
Calcium: 9.9 mg/dL (ref 8.6–10.3)
Chloride: 103 mmol/L (ref 98–110)
Creat: 1.25 mg/dL (ref 0.70–1.35)
Globulin: 2.3 g/dL (ref 1.9–3.7)
Glucose, Bld: 114 mg/dL — ABNORMAL HIGH (ref 65–99)
Potassium: 3.9 mmol/L (ref 3.5–5.3)
Sodium: 139 mmol/L (ref 135–146)
Total Bilirubin: 0.9 mg/dL (ref 0.2–1.2)
Total Protein: 7.3 g/dL (ref 6.1–8.1)
eGFR: 63 mL/min/{1.73_m2} (ref >=60)

## 2023-10-20 LAB — PSA: PSA: 2.37 ng/mL (ref <=4.00)

## 2023-10-20 LAB — CBC WITH DIFFERENTIAL/PLATELET
Absolute Lymphocytes: 1542 {cells}/uL (ref 850–3900)
Absolute Monocytes: 582 {cells}/uL (ref 200–950)
Basophils Absolute: 48 {cells}/uL (ref 0–200)
Basophils Relative: 0.8 %
Eosinophils Absolute: 252 {cells}/uL (ref 15–500)
Eosinophils Relative: 4.2 %
HCT: 45.6 % (ref 38.5–50.0)
Hemoglobin: 14.9 g/dL (ref 13.2–17.1)
MCH: 29.6 pg (ref 27.0–33.0)
MCHC: 32.7 g/dL (ref 32.0–36.0)
MCV: 90.5 fL (ref 80.0–100.0)
MPV: 9.9 fL (ref 7.5–12.5)
Monocytes Relative: 9.7 %
Neutro Abs: 3576 {cells}/uL (ref 1500–7800)
Neutrophils Relative %: 59.6 %
Platelets: 278 10*3/uL (ref 140–400)
RBC: 5.04 10*6/uL (ref 4.20–5.80)
RDW: 13.3 % (ref 11.0–15.0)
Total Lymphocyte: 25.7 %
WBC: 6 10*3/uL (ref 3.8–10.8)

## 2023-10-20 LAB — HEMOGLOBIN A1C
Hgb A1c MFr Bld: 6.4 %{Hb} — ABNORMAL HIGH (ref <5.7)
Mean Plasma Glucose: 137 mg/dL
eAG (mmol/L): 7.6 mmol/L

## 2023-10-20 LAB — MICROALBUMIN / CREATININE URINE RATIO
Creatinine, Urine: 147 mg/dL (ref 20–320)
Microalb Creat Ratio: 2 mg/g{creat} (ref <30)
Microalb, Ur: 0.3 mg/dL

## 2023-10-20 LAB — LIPID PANEL
Cholesterol: 169 mg/dL (ref <200)
HDL: 61 mg/dL (ref >=40)
LDL Cholesterol (Calc): 89 mg/dL
Non-HDL Cholesterol (Calc): 108 mg/dL (ref <130)
Total CHOL/HDL Ratio: 2.8 (calc) (ref <5.0)
Triglycerides: 91 mg/dL (ref <150)

## 2023-10-26 NOTE — Progress Notes (Signed)
 Please attest/co-sign.

## 2023-11-15 ENCOUNTER — Other Ambulatory Visit: Payer: Self-pay | Admitting: Family Medicine

## 2023-11-15 NOTE — Telephone Encounter (Signed)
 Requested Prescriptions  Pending Prescriptions Disp Refills   gabapentin (NEURONTIN) 300 MG capsule [Pharmacy Med Name: GABAPENTIN 300 MG CAPSULE] 90 capsule 3    Sig: Take 1 capsule (300 mg total) by mouth at bedtime.     Neurology: Anticonvulsants - gabapentin Passed - 11/15/2023  3:57 PM      Passed - Cr in normal range and within 360 days    Creat  Date Value Ref Range Status  10/19/2023 1.25 0.70 - 1.35 mg/dL Final   Creatinine, Urine  Date Value Ref Range Status  10/19/2023 147 20 - 320 mg/dL Final         Passed - Completed PHQ-2 or PHQ-9 in the last 360 days      Passed - Valid encounter within last 12 months    Recent Outpatient Visits           3 weeks ago Encounter for [employer] annual wellness exam   [place] [facility] Donita Brooks, MD   1 year ago General medical exam   [place] [facility] Pickard, Priscille Heidelberg, MD

## 2024-03-20 ENCOUNTER — Ambulatory Visit

## 2024-03-21 ENCOUNTER — Ambulatory Visit: Payer: PPO

## 2024-05-01 ENCOUNTER — Other Ambulatory Visit: Payer: Self-pay | Admitting: Family Medicine

## 2024-05-01 DIAGNOSIS — E7800 Pure hypercholesterolemia, unspecified: Secondary | ICD-10-CM

## 2024-05-27 ENCOUNTER — Encounter: Payer: Self-pay | Admitting: Family Medicine

## 2024-05-27 DIAGNOSIS — D2262 Melanocytic nevi of left upper limb, including shoulder: Secondary | ICD-10-CM | POA: Diagnosis not present

## 2024-05-27 DIAGNOSIS — L821 Other seborrheic keratosis: Secondary | ICD-10-CM | POA: Diagnosis not present

## 2024-05-27 DIAGNOSIS — L570 Actinic keratosis: Secondary | ICD-10-CM | POA: Diagnosis not present

## 2024-05-27 DIAGNOSIS — Z85828 Personal history of other malignant neoplasm of skin: Secondary | ICD-10-CM | POA: Diagnosis not present

## 2024-05-28 ENCOUNTER — Other Ambulatory Visit: Payer: Self-pay | Admitting: Family Medicine

## 2024-05-28 MED ORDER — CELECOXIB 200 MG PO CAPS: 200.0000 mg | ORAL_CAPSULE | Freq: Two times a day (BID) | ORAL | 1 refills | Status: AC

## 2024-07-06 ENCOUNTER — Other Ambulatory Visit: Payer: Self-pay | Admitting: Family Medicine

## 2024-08-23 ENCOUNTER — Telehealth: Payer: Self-pay | Admitting: Family Medicine

## 2024-08-23 NOTE — Telephone Encounter (Signed)
 Patient came to the office to request a clearance letter for gabapentin  (NEURONTIN ) 300 MG capsule   Patient dropped off copy of signed request from PA-C Amu Arruda.  Letter placed on desk of provider's nurse.   Please advise patient when letter ready for pickup at [phone].

## 2024-08-26 ENCOUNTER — Other Ambulatory Visit: Payer: Self-pay | Admitting: Family Medicine

## 2024-08-26 ENCOUNTER — Encounter: Payer: Self-pay | Admitting: Family Medicine

## 2024-08-26 NOTE — Telephone Encounter (Signed)
 Copied from CRM [id number]. Topic: General - Other >> Aug 26, 2024  8:48 AM Larissa RAMAN wrote: Reason for CRM: Patient spouse, calling to check the status of medical clearance letter for Gabapentin . Requesting a callback when ready for pickup.

## 2024-08-27 ENCOUNTER — Telehealth: Payer: Self-pay

## 2024-08-27 NOTE — Telephone Encounter (Signed)
 Copied from CRM [id number]. Topic: General - Other >> Aug 26, 2024  8:48 AM Larissa RAMAN wrote: Reason for CRM: Patient spouse, calling to check the status of medical clearance letter for Gabapentin . Requesting a callback when ready for pickup. >> Aug 26, 2024  8:59 AM Wess RAMAN wrote: Patient wife, Karna, stated to call [phone] or [phone] when letter is ready.

## 2024-08-28 ENCOUNTER — Telehealth: Payer: Self-pay

## 2024-08-28 NOTE — Telephone Encounter (Signed)
 Copied from CRM [id number]. Topic: General - Other >> Aug 26, 2024  8:48 AM Larissa RAMAN wrote: Reason for CRM: Patient spouse, calling to check the status of medical clearance letter for Gabapentin . Requesting a callback when ready for pickup. >> Aug 26, 2024  8:59 AM Wess RAMAN wrote: Patient wife, Karna, stated to call [phone] or [phone] when letter is ready.

## 2024-10-18 ENCOUNTER — Other Ambulatory Visit

## 2024-10-21 ENCOUNTER — Encounter: Admitting: Family Medicine
